# Patient Record
Sex: Male | Born: 1960
Health system: Southern US, Community
[De-identification: ages and names within clinical notes are randomized; demographics above are authoritative.]

## PROBLEM LIST (undated history)

## (undated) DIAGNOSIS — N200 Calculus of kidney: Secondary | ICD-10-CM

## (undated) DIAGNOSIS — R809 Proteinuria, unspecified: Secondary | ICD-10-CM

## (undated) DIAGNOSIS — E7800 Pure hypercholesterolemia, unspecified: Secondary | ICD-10-CM

## (undated) HISTORY — PX: REPAIR / RECONSTRUCTION COLLATERAL LIGAMENT HAND: SUR1146

---

## 2013-08-21 HISTORY — PX: OTHER SURGICAL HISTORY: SHX169

## 2013-12-22 ENCOUNTER — Other Ambulatory Visit (HOSPITAL_COMMUNITY)
Admission: RE | Admit: 2013-12-22 | Discharge: 2013-12-22 | Disposition: A | Discharge status: Home / Self Care | Payer: No Typology Code available for payment source | Source: Ambulatory Visit | Attending: Family Medicine | Admitting: Family Medicine

## 2013-12-22 ENCOUNTER — Emergency Department (HOSPITAL_COMMUNITY)
Admission: EM | Admit: 2013-12-22 | Discharge: 2013-12-22 | Disposition: A | Discharge status: Home / Self Care | Payer: No Typology Code available for payment source | Source: Home / Self Care | Attending: Emergency Medicine | Admitting: Emergency Medicine

## 2013-12-22 ENCOUNTER — Encounter (HOSPITAL_COMMUNITY): Payer: Self-pay | Admitting: Emergency Medicine

## 2013-12-22 DIAGNOSIS — Z113 Encounter for screening for infections with a predominantly sexual mode of transmission: Secondary | ICD-10-CM | POA: Insufficient documentation

## 2013-12-22 DIAGNOSIS — R3989 Other symptoms and signs involving the genitourinary system: Secondary | ICD-10-CM

## 2013-12-22 DIAGNOSIS — N399 Disorder of urinary system, unspecified: Secondary | ICD-10-CM

## 2013-12-22 HISTORY — DX: Calculus of kidney: N20.0

## 2013-12-22 HISTORY — DX: Pure hypercholesterolemia, unspecified: E78.00

## 2013-12-22 HISTORY — DX: Proteinuria, unspecified: R80.9

## 2013-12-22 LAB — POCT URINALYSIS DIP (DEVICE)
Bilirubin Urine: NEGATIVE
Glucose, UA: NEGATIVE mg/dL
Hgb urine dipstick: NEGATIVE
Ketones, ur: NEGATIVE mg/dL
Leukocytes, UA: NEGATIVE
Nitrite: NEGATIVE
PROTEIN: NEGATIVE mg/dL
Specific Gravity, Urine: >=1.03 (ref 1.005–1.030)
UROBILINOGEN UA: 0.2 mg/dL (ref 0.0–1.0)
pH: 6 (ref 5.0–8.0)

## 2013-12-22 NOTE — Discharge Instructions (Signed)
Please contact the urologist listed on your discharge paperwork should you wish additional evaluation

## 2013-12-22 NOTE — ED Provider Notes (Signed)
CSN: [id number]     Arrival date & time 12/22/13  1700 History   First MD Initiated Contact with Patient 12/22/13 1816     Chief Complaint  Patient presents with  . Proteinuria   (Consider location/radiation/quality/duration/timing/severity/associated sxs/prior Treatment) HPI Comments: Patient states [place] he went to see his MD about his concern that his urine was "foamy" and "dark." he was told by his MD that this may be an indication of excessive protein in his urine. He was prescribed an unknown medication to take for 3mos and was advised to then follow up. He has since moved here and is still having the issue despite having completed the medication as prescribed. Patient was also instructed to increase his daily water intake and he endorses that he has not done this.  PCP: none   The history is provided by the patient.    Past Medical History  Diagnosis Date  . Kidney stones   . High cholesterol   . Proteinuria    Past Surgical History  Procedure Laterality Date  . Nerve release hand Left 2015   Family History  Problem Relation Age of Onset  . Hypertension Father   . Stroke Brother    History  Substance Use Topics  . Smoking status: Never Smoker   . Smokeless tobacco: Not on file  . Alcohol Use: No    Review of Systems  Constitutional: Negative for fever, activity change and unexpected weight change.  HENT: Negative.   Eyes: Negative.   Respiratory: Negative.   Cardiovascular: Negative.   Gastrointestinal: Negative.   Endocrine: Negative for polydipsia, polyphagia and polyuria.  Genitourinary: Negative for dysuria, urgency, frequency, hematuria, flank pain, decreased urine volume, discharge, penile swelling, scrotal swelling, enuresis, difficulty urinating, genital sores, penile pain and testicular pain.       See HPI  Musculoskeletal: Negative.   Skin: Negative.   Allergic/Immunologic: Negative for  immunocompromised state.  Neurological: Negative.     Allergies  Review of patient's allergies indicates no known allergies.  Home Medications   Prior to Admission medications   Not on File   BP 142/79  Pulse 75  Temp(Src) 98 F (36.7 C) (Oral)  Resp 18  SpO2 96% Physical Exam  Nursing note and vitals reviewed. Constitutional: He is oriented to person, place, and time. He appears well-developed and well-nourished. No distress.  HENT:  Head: Normocephalic and atraumatic.  Eyes: Conjunctivae are normal. No scleral icterus.  Cardiovascular: Normal rate, regular rhythm and normal heart sounds.   Pulmonary/Chest: Effort normal and breath sounds normal.  Abdominal: Soft. Bowel sounds are normal. He exhibits no distension and no mass. There is no tenderness. There is no CVA tenderness.  Genitourinary:  Declined examination of his genitalia.   Musculoskeletal: Normal range of motion.  Neurological: He is alert and oriented to person, place, and time.  Skin: Skin is warm and dry. No rash noted.  Psychiatric: He has a normal mood and affect. His behavior is normal.    ED Course  Procedures (including critical care time) Labs Review Labs Reviewed  URINE CULTURE  POCT URINALYSIS DIP (DEVICE)  URINE CYTOLOGY ANCILLARY ONLY    Imaging Review No results found.   MDM   1. Urinary problem    UA: grossly normal with exception of increased spec gravity, indicating probable dehydration. Was again advised that he should increase his daily water intake.  Urine sent for C&S.  Suggested that if patient  has continued concern, I would be glad to refer him to urology and he stated he would welcome this information.  States he was pleased that his urine today was without evidence of proteinuria or infection.  Suggested to patient that he also work toward finding himself a PCP.   Jess BartersJennifer Lee MelvinPresson, GeorgiaPA 12/22/13 1911

## 2013-12-22 NOTE — ED Notes (Signed)
States his urine has been foamy for 3 mos. Sometimes its dark.  Hx. of same in his country and was told he had protein in his urine.  Was given 1 pill for 3 weeks and was supposed to f/u with his doctor but could not.  Medicine did not help. Can't remember the name of it.  Hx. Kidney stone.

## 2013-12-23 LAB — URINE CYTOLOGY ANCILLARY ONLY
Chlamydia: NEGATIVE
NEISSERIA GONORRHEA: NEGATIVE
TRICH (WINDOWPATH): NEGATIVE

## 2013-12-23 LAB — URINE CULTURE
Colony Count: NO GROWTH
Culture: NO GROWTH
Special Requests: NORMAL

## 2013-12-23 NOTE — ED Provider Notes (Signed)
Medical screening examination/treatment/procedure(s) were performed by resident physician or non-physician practitioner and as supervising physician I was immediately available for consultation/collaboration.   [name],[name].   [name], MD 12/23/13 0816 

## 2014-01-09 ENCOUNTER — Ambulatory Visit: Payer: No Typology Code available for payment source | Attending: Internal Medicine

## 2014-08-04 ENCOUNTER — Ambulatory Visit: Payer: No Typology Code available for payment source | Admitting: Family Medicine

## 2014-08-04 ENCOUNTER — Encounter (HOSPITAL_COMMUNITY): Payer: Self-pay | Admitting: Emergency Medicine

## 2014-08-04 ENCOUNTER — Emergency Department (HOSPITAL_COMMUNITY)
Admission: EM | Admit: 2014-08-04 | Discharge: 2014-08-04 | Disposition: A | Discharge status: Home / Self Care | Payer: No Typology Code available for payment source | Attending: Emergency Medicine | Admitting: Emergency Medicine

## 2014-08-04 DIAGNOSIS — Y92410 Unspecified street and highway as the place of occurrence of the external cause: Secondary | ICD-10-CM | POA: Insufficient documentation

## 2014-08-04 DIAGNOSIS — Y998 Other external cause status: Secondary | ICD-10-CM | POA: Insufficient documentation

## 2014-08-04 DIAGNOSIS — Z87442 Personal history of urinary calculi: Secondary | ICD-10-CM | POA: Insufficient documentation

## 2014-08-04 DIAGNOSIS — S199XXA Unspecified injury of neck, initial encounter: Secondary | ICD-10-CM | POA: Insufficient documentation

## 2014-08-04 DIAGNOSIS — Y9389 Activity, other specified: Secondary | ICD-10-CM | POA: Insufficient documentation

## 2014-08-04 DIAGNOSIS — Z8639 Personal history of other endocrine, nutritional and metabolic disease: Secondary | ICD-10-CM | POA: Insufficient documentation

## 2014-08-04 MED ORDER — CYCLOBENZAPRINE HCL 10 MG PO TABS: 10.0000 mg | ORAL_TABLET | Freq: Two times a day (BID) | ORAL | Status: DC | PRN

## 2014-08-04 MED ORDER — HYDROCODONE-ACETAMINOPHEN 5-325 MG PO TABS: 1.0000 | ORAL_TABLET | ORAL | Status: DC | PRN

## 2014-08-04 MED ORDER — MELOXICAM 15 MG PO TABS: 15.0000 mg | ORAL_TABLET | Freq: Every day | ORAL | Status: DC
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
## 2014-08-05 ENCOUNTER — Ambulatory Visit: Payer: No Typology Code available for payment source | Attending: Internal Medicine

## 2014-08-25 ENCOUNTER — Encounter: Payer: Self-pay | Admitting: Family Medicine

## 2014-08-25 ENCOUNTER — Ambulatory Visit (INDEPENDENT_AMBULATORY_CARE_PROVIDER_SITE_OTHER): Payer: No Typology Code available for payment source | Admitting: Family Medicine

## 2014-08-25 VITALS — BP 131/69 | HR 70 | Temp 98.2°F | Resp 16 | Ht 72.0 in | Wt 201.0 lb

## 2014-08-25 DIAGNOSIS — Z0000 Encounter for general adult medical examination without abnormal findings: Secondary | ICD-10-CM

## 2014-08-25 DIAGNOSIS — Z87898 Personal history of other specified conditions: Secondary | ICD-10-CM

## 2014-08-25 DIAGNOSIS — Z23 Encounter for immunization: Secondary | ICD-10-CM

## 2014-08-25 DIAGNOSIS — Z8639 Personal history of other endocrine, nutritional and metabolic disease: Secondary | ICD-10-CM

## 2014-08-25 NOTE — Patient Instructions (Addendum)

## 2014-08-25 NOTE — Progress Notes (Signed)
Subjective:    Patient ID: Jeffery Reyes, male    DOB: 11/06/1960, 54 y.o.   MRN: [id number]  HPI  Patient presents to establish care accompanied by wife. He reports that he has not had a primary physician since moving to the [place] from [place] 8 years ago. He states that he has had a previous nerve release surgery to left hand and spinal stenosis that was diagnosed in [place]. He states that he has occasional numbness and tingling in left upper extremity post surgery. He states that  Patient currently denies crepitation, decreased range of motion, deformity, edema, erythema, instability, tenderness and warmth.    Jeffery Reyes also states that he has a history of high cholesterol; he has not been following a low fat diet or medication regimen. He denies headache, shortness of breath, numbness, tingling, nausea, vomiting, or diarrhea.   Past Medical History  Diagnosis Date  . Kidney stones   . High cholesterol   . Proteinuria      Review of Systems  Constitutional: Negative.   Eyes: Negative.   Respiratory: Negative.  Negative for shortness of breath, wheezing and stridor.   Cardiovascular: Negative for palpitations and leg swelling.  Gastrointestinal: Negative.  Negative for nausea, diarrhea and constipation.  Endocrine: Negative.  Negative for polydipsia, polyphagia and polyuria.  Genitourinary: Negative.   Musculoskeletal: Negative.   Skin: Negative.   Allergic/Immunologic: Negative.   Neurological: Negative for syncope and speech difficulty.  Hematological: Negative.   Psychiatric/Behavioral: Negative.  Negative for suicidal ideas and sleep disturbance.       Objective:   Physical Exam  Constitutional: He is oriented to person, place, and time. He appears well-developed and well-nourished.  HENT:  Head: Normocephalic and atraumatic.  Right Ear: External ear normal.  Left Ear: External ear normal.  Mouth/Throat: Oropharynx is clear and moist.  Eyes: Conjunctivae and EOM are normal.  Pupils are equal, round, and reactive to light.  Neck: Normal range of motion. Neck supple.  Cardiovascular: Normal rate, regular rhythm, normal heart sounds and intact distal pulses.   Pulmonary/Chest: Effort normal and breath sounds normal.  Abdominal: Soft. Bowel sounds are normal.  Musculoskeletal: Normal range of motion.  Neurological: He is alert and oriented to person, place, and time. He has normal reflexes.  Skin: Skin is warm and dry.  Psychiatric: He has a normal mood and affect. His behavior is normal. Judgment and thought content normal.         BP 131/69 mmHg  Pulse 70  Temp(Src) 98.2 F (36.8 C) (Oral)  Resp 16  Ht 6' (1.829 m)  Wt 201 lb (91.173 kg)  BMI 27.25 kg/m2 Assessment & Plan:  1.  History of high cholesterol Recommend that he start a low fat, low carbohydrate diett divided over 6 small meals. Increase daily water intake, and exercise 3-4 times per week.   I will check a fasting llipid panel in 1 week  2. History of paresthesia He states that he has periodic numbness and tingling to upper extremities. He had a previous surgery for carpal tunnel and a history of spinal stenosis when in [place] 8 years ago.    3. Need for immunization against influenza - Flu Vaccine QUAD 36+ mos IM (Fluarix)  4. Need for Tdap vaccination - Tdap vaccine greater than or equal to 7yo IM  5. Annual physical exam Jeffery Reyes will return in 1 month for a complete physical examination with prostate check. He states that he has never had a  colonoscopy.  - Lipid Panel; Future - COMPLETE METABOLIC PANEL WITH GFR; Future - CBC with Differential; Future - Vitamin D, 25-hydroxy; Future - Urinalysis, Complete; Future    Chart reviewed: immunizations are up to date and documented.  Massie MaroonHollis,Lachina M, FNP

## 2014-09-01 ENCOUNTER — Other Ambulatory Visit: Payer: Self-pay | Admitting: Family Medicine

## 2014-09-01 ENCOUNTER — Other Ambulatory Visit (INDEPENDENT_AMBULATORY_CARE_PROVIDER_SITE_OTHER): Payer: No Typology Code available for payment source

## 2014-09-01 DIAGNOSIS — Z0000 Encounter for general adult medical examination without abnormal findings: Secondary | ICD-10-CM

## 2014-09-01 LAB — COMPLETE METABOLIC PANEL WITH GFR
ALBUMIN: 4.1 g/dL (ref 3.5–5.2)
ALK PHOS: 46 U/L (ref 39–117)
ALT: 12 U/L (ref 0–53)
AST: 15 U/L (ref 0–37)
BUN: 10 mg/dL (ref 6–23)
CO2: 27 mEq/L (ref 19–32)
Calcium: 9.3 mg/dL (ref 8.4–10.5)
Chloride: 104 mEq/L (ref 96–112)
Creat: 0.84 mg/dL (ref 0.50–1.35)
GFR, Est African American: >89 mL/min
GFR, Est Non African American: >89 mL/min
Glucose, Bld: 95 mg/dL (ref 70–99)
Potassium: 3.8 mEq/L (ref 3.5–5.3)
Sodium: 139 mEq/L (ref 135–145)
Total Bilirubin: 0.6 mg/dL (ref 0.2–1.2)
Total Protein: 6.6 g/dL (ref 6.0–8.3)

## 2014-09-01 LAB — LIPID PANEL
CHOL/HDL RATIO: 4.4 ratio
CHOLESTEROL: 199 mg/dL (ref 0–200)
HDL: 45 mg/dL (ref >39)
LDL Cholesterol: 125 mg/dL — ABNORMAL HIGH (ref 0–99)
Triglycerides: 143 mg/dL (ref <150)
VLDL: 29 mg/dL (ref 0–40)

## 2014-09-02 LAB — CBC WITH DIFFERENTIAL/PLATELET
BASOS ABS: 0.1 10*3/uL (ref 0.0–0.1)
BASOS PCT: 2 % — AB (ref 0–1)
EOS ABS: 0.1 10*3/uL (ref 0.0–0.7)
EOS PCT: 4 % (ref 0–5)
HCT: 43.5 % (ref 39.0–52.0)
HEMOGLOBIN: 15.2 g/dL (ref 13.0–17.0)
LYMPHS ABS: 1.8 10*3/uL (ref 0.7–4.0)
Lymphocytes Relative: 55 % — ABNORMAL HIGH (ref 12–46)
MCH: 30.2 pg (ref 26.0–34.0)
MCHC: 34.9 g/dL (ref 30.0–36.0)
MCV: 86.3 fL (ref 78.0–100.0)
MPV: 10.4 fL (ref 8.6–12.4)
Monocytes Absolute: 0.3 10*3/uL (ref 0.1–1.0)
Monocytes Relative: 10 % (ref 3–12)
Neutro Abs: 0.9 10*3/uL — ABNORMAL LOW (ref 1.7–7.7)
Neutrophils Relative %: 29 % — ABNORMAL LOW (ref 43–77)
PLATELETS: 206 10*3/uL (ref 150–400)
RBC: 5.04 MIL/uL (ref 4.22–5.81)
RDW: 13.5 % (ref 11.5–15.5)
WBC: 3.2 10*3/uL — ABNORMAL LOW (ref 4.0–10.5)

## 2014-09-02 LAB — URINALYSIS, COMPLETE
Bacteria, UA: NONE SEEN
Casts: NONE SEEN
Crystals: NONE SEEN
GLUCOSE, UA: NEGATIVE mg/dL
HGB URINE DIPSTICK: NEGATIVE
KETONES UR: NEGATIVE mg/dL
Leukocytes, UA: NEGATIVE
Nitrite: NEGATIVE
Protein, ur: NEGATIVE mg/dL
SPECIFIC GRAVITY, URINE: 1.026 (ref 1.005–1.030)
Squamous Epithelial / LPF: NONE SEEN
Urobilinogen, UA: 0.2 mg/dL (ref 0.0–1.0)
pH: 5.5 (ref 5.0–8.0)

## 2014-09-02 LAB — PATHOLOGIST SMEAR REVIEW

## 2014-09-02 LAB — VITAMIN D 25 HYDROXY (VIT D DEFICIENCY, FRACTURES): VIT D 25 HYDROXY: 10 ng/mL — AB (ref 30–100)

## 2014-09-04 ENCOUNTER — Telehealth: Payer: Self-pay | Admitting: Family Medicine

## 2014-09-04 DIAGNOSIS — D709 Neutropenia, unspecified: Secondary | ICD-10-CM | POA: Insufficient documentation

## 2014-09-04 DIAGNOSIS — E559 Vitamin D deficiency, unspecified: Secondary | ICD-10-CM | POA: Insufficient documentation

## 2014-09-04 LAB — HIV ANTIBODY (ROUTINE TESTING W REFLEX): HIV 1&2 Ab, 4th Generation: NONREACTIVE

## 2014-09-04 MED ORDER — ERGOCALCIFEROL 1.25 MG (50000 UT) PO CAPS: 50000.0000 [IU] | ORAL_CAPSULE | ORAL | Status: DC

## 2014-09-04 NOTE — Telephone Encounter (Signed)
Meds ordered this encounter  Medications  . ergocalciferol (DRISDOL) 50000 UNITS capsule    Sig: Take 1 capsule (50,000 Units total) by mouth once a week.    Dispense:  4 capsule    Refill:  3  Reviewed labs; Mr. Jeffery Reyes has a vitamin D deficiency, I will start weekly vitamin D replacement. Patient also has neutropenia, I will repeat CBC in 1 month.    Massie MaroonHollis,Jeffery [place] M, FNP

## 2014-09-07 ENCOUNTER — Ambulatory Visit: Payer: No Typology Code available for payment source | Attending: Internal Medicine

## 2014-09-07 NOTE — Telephone Encounter (Signed)
Called, no answer, left message.  need to advise patient of vitamin D deficiency.

## 2014-09-23 ENCOUNTER — Other Ambulatory Visit: Payer: Self-pay | Admitting: Family Medicine

## 2014-09-23 ENCOUNTER — Ambulatory Visit (INDEPENDENT_AMBULATORY_CARE_PROVIDER_SITE_OTHER): Payer: No Typology Code available for payment source | Admitting: Family Medicine

## 2014-09-23 VITALS — BP 137/75 | HR 71 | Temp 98.1°F | Resp 16 | Ht 72.0 in | Wt 202.0 lb

## 2014-09-23 DIAGNOSIS — Z0000 Encounter for general adult medical examination without abnormal findings: Secondary | ICD-10-CM

## 2014-09-23 DIAGNOSIS — E785 Hyperlipidemia, unspecified: Secondary | ICD-10-CM | POA: Insufficient documentation

## 2014-09-23 LAB — IFOBT (OCCULT BLOOD): IFOBT: NEGATIVE

## 2014-09-23 MED ORDER — ASPIRIN EC 81 MG PO TBEC: 81.0000 mg | DELAYED_RELEASE_TABLET | Freq: Every day | ORAL | Status: DC

## 2014-09-23 NOTE — Patient Instructions (Addendum)
Dyslipidemia Dyslipidemia is an imbalance of the lipids in your blood. Lipids are waxy, fat-like proteins that your body needs in small amounts. Dyslipidemia often involves the lipids cholesterol or triglycerides. Common forms of dyslipidemia are:  High levels of bad cholesterol (LDL cholesterol). LDL cholesterol is the type of cholesterol that causes heart disease.  Low levels of good cholesterol (HDL cholesterol). HDL cholesterol is the type of cholesterol that helps protect against heart disease.  High levels of triglycerides. Triglycerides are a fatty substance in the blood linked to a buildup of plaque on your arteries. RISK FACTORS  Increased age.  Having a family history of high cholesterol.  Certain medicines, including birth control pills, diuretics, beta-blockers, and some medicines for depression.  Smoking.  Eating a high-fat diet.  Being overweight.  Medical conditions such as diabetes, polycystic ovary syndrome, pregnancy, kidney disease, and hypothyroidism.  Lack of regular exercise. SIGNS AND SYMPTOMS There are no signs or symptoms with dyslipidemia.  DIAGNOSIS  A simple blood test called a fasting blood test can be done to determine your level of:  Total cholesterol. This is the combined number of LDL cholesterol and HDL cholesterol. A healthy number is lower than 200.  LDL cholesterol. The goal number for LDL cholesterol is different for each person depending on risk factors. Ask your health care provider what your LDL cholesterol number should be.  HDL cholesterol. A healthy level of HDL cholesterol is 60 or higher. A number lower than 40 for men or 50 for women is a danger sign.  Triglycerides. A healthy triglyceride number is less than 150. TREATMENT  Dyslipidemia is a treatable condition. Your health care provider will advise you on what type of treatment is best based on your age, your test results, and current guidelines. Treatment may include:   Dietary  changes. A dietitian can help you create a meal plan. You may need to:  Eat more foods that contain omega-3s, such as salmon and other fish.  Replace saturated fats and trans fats in your diet with healthy fats such as nuts, seeds, avocados, olive oil, and canola oil.  Regular exercise. This can help lower your LDL cholesterol, raise your HDL cholesterol, and help with weight management. Check with your health care provider before beginning an exercise program. Most people should participate in 30 minutes of brisk exercise 5 days a week.  Quitting smoking.  Medicines to lower LDL cholesterol and triglycerides. Your health care provider will monitor your lipid levels with regular blood tests. HOME CARE INSTRUCTIONS  Eat a healthy diet. Follow any diet instructions if they were given to you by your health care provider.  Maintain a healthy weight.  Exercise regularly based on the recommendations of your health care provider.  Do not use any tobacco products, including cigarettes, chewing tobacco, or electronic cigarettes.  Take medicines only as directed by your health care provider.  Keep all follow-up visits as directed by your health care provider. SEEK MEDICAL CARE IF: You are having possible side effects from your medicines. Document Released: 08/12/2013 Document Revised: 12/22/2013 Document Reviewed: 08/12/2013 [facility] Patient Information 2015 [place], [place]. This information is not intended to replace advice given to you by your health care provider. Make sure you discuss any questions you have with your health care provider. Health Maintenance A healthy lifestyle and preventative care can promote health and wellness.  Maintain regular health, dental, and eye exams.  Eat a healthy diet. Foods like vegetables, fruits, whole grains, low-fat dairy products, and  lean protein foods contain the nutrients you need and are low in calories. Decrease your intake of foods high in solid  fats, added sugars, and salt. Get information about a proper diet from your health care provider, if necessary.  Regular physical exercise is one of the most important things you can do for your health. Most adults should get at least 150 minutes of moderate-intensity exercise (any activity that increases your heart rate and causes you to sweat) each week. In addition, most adults need muscle-strengthening exercises on 2 or more days a week.   Maintain a healthy weight. The body mass index (BMI) is a screening tool to identify possible weight problems. It provides an estimate of body fat based on height and weight. Your health care provider can find your BMI and can help you achieve or maintain a healthy weight. For males 20 years and older:  A BMI below 18.5 is considered underweight.  A BMI of 18.5 to 24.9 is normal.  A BMI of 25 to 29.9 is considered overweight.  A BMI of 30 and above is considered obese.  Maintain normal blood lipids and cholesterol by exercising and minimizing your intake of saturated fat. Eat a balanced diet with plenty of fruits and vegetables. Blood tests for lipids and cholesterol should begin at age 17 and be repeated every 5 years. If your lipid or cholesterol levels are high, you are over age 38, or you are at high risk for heart disease, you may need your cholesterol levels checked more frequently.Ongoing high lipid and cholesterol levels should be treated with medicines if diet and exercise are not working.  If you smoke, find out from your health care provider how to quit. If you do not use tobacco, do not start.  Lung cancer screening is recommended for adults aged 55-80 years who are at high risk for developing lung cancer because of a history of smoking. A yearly low-dose CT scan of the lungs is recommended for people who have at least a 30-pack-year history of smoking and are current smokers or have quit within the past 15 years. A pack year of smoking is  smoking an average of 1 pack of cigarettes a day for 1 year (for example, a 30-pack-year history of smoking could mean smoking 1 pack a day for 30 years or 2 packs a day for 15 years). Yearly screening should continue until the smoker has stopped smoking for at least 15 years. Yearly screening should be stopped for people who develop a health problem that would prevent them from having lung cancer treatment.  If you choose to drink alcohol, do not have more than 2 drinks per day. One drink is considered to be 12 oz (360 mL) of beer, 5 oz (150 mL) of wine, or 1.5 oz (45 mL) of liquor.  Avoid the use of street drugs. Do not share needles with anyone. Ask for help if you need support or instructions about stopping the use of drugs.  High blood pressure causes heart disease and increases the risk of stroke. Blood pressure should be checked at least every 1-2 years. Ongoing high blood pressure should be treated with medicines if weight loss and exercise are not effective.  If you are 98-40 years old, ask your health care provider if you should take aspirin to prevent heart disease.  Diabetes screening involves taking a blood sample to check your fasting blood sugar level. This should be done once every 3 years after age 44 if  you are at a normal weight and without risk factors for diabetes. Testing should be considered at a younger age or be carried out more frequently if you are overweight and have at least 1 risk factor for diabetes.  Colorectal cancer can be detected and often prevented. Most routine colorectal cancer screening begins at the age of 26 and continues through age 62. However, your health care provider may recommend screening at an earlier age if you have risk factors for colon cancer. On a yearly basis, your health care provider may provide home test kits to check for hidden blood in the stool. A small camera at the end of a tube may be used to directly examine the colon (sigmoidoscopy or  colonoscopy) to detect the earliest forms of colorectal cancer. Talk to your health care provider about this at age 26 when routine screening begins. A direct exam of the colon should be repeated every 5-10 years through age 59, unless early forms of precancerous polyps or small growths are found.  People who are at an increased risk for hepatitis B should be screened for this virus. You are considered at high risk for hepatitis B if:  You were born in a country where hepatitis B occurs often. Talk with your health care provider about which countries are considered high risk.  Your parents were born in a high-risk country and you have not received a shot to protect against hepatitis B (hepatitis B vaccine).  You have HIV or AIDS.  You use needles to inject street drugs.  You live with, or have sex with, someone who has hepatitis B.  You are a man who has sex with other men (MSM).  You get hemodialysis treatment.  You take certain medicines for conditions like cancer, organ transplantation, and autoimmune conditions.  Hepatitis C blood testing is recommended for all people born from [birth year] through 1964 and any individual with known risk factors for hepatitis C.  Healthy men should no longer receive prostate-specific antigen (PSA) blood tests as part of routine cancer screening. Talk to your health care provider about prostate cancer screening.  Testicular cancer screening is not recommended for adolescents or adult males who have no symptoms. Screening includes self-exam, a health care provider exam, and other screening tests. Consult with your health care provider about any symptoms you have or any concerns you have about testicular cancer.  Practice safe sex. Use condoms and avoid high-risk sexual practices to reduce the spread of sexually transmitted infections (STIs).  You should be screened for STIs, including gonorrhea and chlamydia if:  You are sexually active and are younger than  24 years.  You are older than 24 years, and your health care provider tells you that you are at risk for this type of infection.  Your sexual activity has changed since you were last screened, and you are at an increased risk for chlamydia or gonorrhea. Ask your health care provider if you are at risk.  If you are at risk of being infected with HIV, it is recommended that you take a prescription medicine daily to prevent HIV infection. This is called pre-exposure prophylaxis (PrEP). You are considered at risk if:  You are a man who has sex with other men (MSM).  You are a heterosexual man who is sexually active with multiple partners.  You take drugs by injection.  You are sexually active with a partner who has HIV.  Talk with your health care provider about whether you are at  high risk of being infected with HIV. If you choose to begin PrEP, you should first be tested for HIV. You should then be tested every 3 months for as long as you are taking PrEP.  Use sunscreen. Apply sunscreen liberally and repeatedly throughout the day. You should seek shade when your shadow is shorter than you. Protect yourself by wearing long sleeves, pants, a wide-brimmed hat, and sunglasses year round whenever you are outdoors.  Tell your health care provider of new moles or changes in moles, especially if there is a change in shape or color. Also, tell your health care provider if a mole is larger than the size of a pencil eraser.  A one-time screening for abdominal aortic aneurysm (AAA) and surgical repair of large AAAs by ultrasound is recommended for men aged 65-75 years who are current or former smokers.  Stay current with your vaccines (immunizations). Document Released: 02/03/2008 Document Revised: 08/12/2013 Document Reviewed: 01/02/2011 [facility] Patient Information 2015 [place], [place]. This information is not intended to replace advice given to you by your health care provider. Make sure you discuss  any questions you have with your health care provider. Vitamin D Deficiency Vitamin D is an important vitamin that your body needs. Having too little of it in your body is called a deficiency. A very bad deficiency can make your bones soft and can cause a condition called rickets.  Vitamin D is important to your body for different reasons, such as:   It helps your body absorb 2 minerals called calcium and phosphorus.  It helps make your bones healthy.  It may prevent some diseases, such as diabetes and multiple sclerosis.  It helps your muscles and heart. You can get vitamin D in several ways. It is a natural part of some foods. The vitamin is also added to some dairy products and cereals. Some people take vitamin D supplements. Also, your body makes vitamin D when you are in the sun. It changes the sun's rays into a form of the vitamin that your body can use. CAUSES   Not eating enough foods that contain vitamin D.  Not getting enough sunlight.  Having certain digestive system diseases that make it hard to absorb vitamin D. These diseases include Crohn's disease, chronic pancreatitis, and cystic fibrosis.  Having a surgery in which part of the stomach or small intestine is removed.  Being obese. Fat cells pull vitamin D out of your blood. That means that obese people may not have enough vitamin D left in their blood and in other body tissues.  Having chronic kidney or liver disease. RISK FACTORS Risk factors are things that make you more likely to develop a vitamin D deficiency. They include:  Being older.  Not being able to get outside very much.  Living in a nursing home.  Having had broken bones.  Having weak or thin bones (osteoporosis).  Having a disease or condition that changes how your body absorbs vitamin D.  Having dark skin.  Some medicines such as seizure medicines or steroids.  Being overweight or obese. SYMPTOMS Mild cases of vitamin D deficiency may not  have any symptoms. If you have a very bad case, symptoms may include:  Bone pain.  Muscle pain.  Falling often.  Broken bones caused by a minor injury, due to osteoporosis. DIAGNOSIS A blood test is the best way to tell if you have a vitamin D deficiency. TREATMENT Vitamin D deficiency can be treated in different ways. Treatment for  vitamin D deficiency depends on what is causing it. Options include:  Taking vitamin D supplements.  Taking a calcium supplement. Your caregiver will suggest what dose is best for you. HOME CARE INSTRUCTIONS  Take any supplements that your caregiver prescribes. Follow the directions carefully. Take only the suggested amount.  Have your blood tested 2 months after you start taking supplements.  Eat foods that contain vitamin D. Healthy choices include:  Fortified dairy products, cereals, or juices. Fortified means vitamin D has been added to the food. Check the label on the package to be sure.  Fatty fish like salmon or trout.  Eggs.  Oysters.  Do not use a tanning bed.  Keep your weight at a healthy level. Lose weight if you need to.  Keep all follow-up appointments. Your caregiver will need to perform blood tests to make sure your vitamin D deficiency is going away. SEEK MEDICAL CARE IF:  You have any questions about your treatment.  You continue to have symptoms of vitamin D deficiency.  You have nausea or vomiting.  You are constipated.  You feel confused.  You have severe abdominal or back pain. MAKE SURE YOU:  Understand these instructions.  Will watch your condition.  Will get help right away if you are not doing well or get worse. Document Released: 10/30/2011 Document Revised: 12/02/2012 Document Reviewed: 10/30/2011 [facility] Patient Information 2015 [place]. This information is not intended to replace advice given to you by your health care provider. Make sure you discuss any questions you have with your health  care provider.

## 2014-09-23 NOTE — Progress Notes (Signed)
[email or web address] [email or web address] Patient ID: Jeffery Reyes, male   DOB: Jul 16, 1961, 54 y.o.   MRN: [id number]  Chief Complaint  Patient presents with  . Annual Exam    HPI Jeffery Reyes is a 54 y.o. male that presents for a complete physical examination. He reports that he is married with 5 children ranging in age from 8712 to 318. He is currently employed.  Jeffery Reyes states that he feels well and is without current complaint. He states that exercises several times per week. He just started an exercise regimen at the beginning of the year. He states that he does not eat a balanced diet. He denies wearing sunscreen. He also does not go to the dentist twice yearly, but has yearly eye examinations.  HPI  Past Medical History  Diagnosis Date  . Kidney stones   . High cholesterol   . Proteinuria     Past Surgical History  Procedure Laterality Date  . Nerve release hand Left 2015    Family History  Problem Relation Age of Onset  . Hypertension Father   . Stroke Brother     Social History History  Substance Use Topics  . Smoking status: Former Games developermoker  . Smokeless tobacco: Not on file  . Alcohol Use: No    No Known Allergies  Current Outpatient Prescriptions  Medication Sig Dispense Refill  . ergocalciferol (DRISDOL) 50000 UNITS capsule Take 1 capsule (50,000 Units total) by mouth once a week. (Patient not taking: Reported on 09/23/2014) 4 capsule 3   No current facility-administered medications for this visit.    Review of Systems Review of Systems  Constitutional: Negative.   HENT: Negative.   Eyes: Negative.   Respiratory: Negative.   Cardiovascular: Negative.   Gastrointestinal: Negative.   Endocrine: Negative.   Genitourinary: Negative.   Musculoskeletal: Negative.   Neurological: Negative.   Hematological: Negative.   Psychiatric/Behavioral: Negative.     Blood pressure 137/75, pulse 71, temperature 98.1 F (36.7 C), temperature source Oral, resp. rate 16, height 6' (1.829 m), weight 202 lb  (91.627 kg).  Physical Exam Physical Exam  Constitutional: He is oriented to person, place, and time.  HENT:  Head: Normocephalic and atraumatic.  Right Ear: External ear normal.  Left Ear: External ear normal.  Mouth/Throat: Oropharynx is clear and moist.  Eyes: Conjunctivae are normal. Pupils are equal, round, and reactive to light.  Neck: Normal range of motion. Neck supple.  Pulmonary/Chest: Effort normal and breath sounds normal.  Abdominal: Soft. Bowel sounds are normal.  Genitourinary: Rectum normal, prostate normal, testes normal and penis normal. Uncircumcised.  Musculoskeletal: Normal range of motion.  Neurological: He is alert and oriented to person, place, and time. He has normal reflexes.  Skin: Skin is warm and dry.  Psychiatric: He has a normal mood and affect. His behavior is normal. Judgment and thought content normal.    Data Reviewed Reviewed previous labs.   Assessment   Reviewed laboratory values. Cholesterol results reviewed. Current ASCVD 11.3 %, recommend moderate dose statin therapy.     During the course of the visit the patient was educated and counseled about appropriate screening and preventive services including:    Influenza vaccine  Td vaccine  Screening electrocardiogram  Colorectal cancer screening  Diabetes screening  Glaucoma screening  Nutrition counseling   Advanced directives: requested  Screening recommendations, referrals: Vaccinations: Please see documentation below and orders this visit.  Nutrition assessed and recommended  Colonoscopy in 2013 Recommend that Jeffery Reyes continues yearly eye exam Recommended  yearly dental visit for hygiene and checkup   Conditions/risks identified: BMI: Discussed weight loss, diet, and increase physical activity.  Increase physical activity: AHA recommends 150 minutes of physical activity a week. Patient is not interested in statin therapy at this point. He will start a low impact  cardiovascular exercise routine 3-4 days per week. Also, low fat ,low salt diet divided over 6 small meals.    Medications reviewed     Annual physical exam - IFOBT POC (occult bld, rslt in office) - EKG 12-Lead   Patient reports Last colonoscopy in 2013   Jeffery Reyes M 09/23/2014, 11:11 AM

## 2014-09-24 ENCOUNTER — Encounter: Payer: Self-pay | Admitting: Family Medicine

## 2015-02-25 ENCOUNTER — Ambulatory Visit: Payer: No Typology Code available for payment source | Admitting: Internal Medicine

## 2015-03-17 ENCOUNTER — Other Ambulatory Visit: Payer: No Typology Code available for payment source

## 2015-03-25 ENCOUNTER — Ambulatory Visit: Payer: No Typology Code available for payment source | Admitting: Family Medicine

## 2015-08-03 ENCOUNTER — Ambulatory Visit: Payer: No Typology Code available for payment source | Attending: Internal Medicine

## 2015-08-09 ENCOUNTER — Ambulatory Visit: Payer: No Typology Code available for payment source

## 2015-11-12 ENCOUNTER — Encounter: Payer: Self-pay | Admitting: Family Medicine

## 2015-11-12 ENCOUNTER — Encounter: Payer: No Typology Code available for payment source | Admitting: Family Medicine

## 2015-11-12 NOTE — Progress Notes (Signed)
This encounter was created in error - please disregard.

## 2015-11-19 ENCOUNTER — Encounter: Payer: Self-pay | Admitting: Family Medicine

## 2015-11-19 ENCOUNTER — Ambulatory Visit (INDEPENDENT_AMBULATORY_CARE_PROVIDER_SITE_OTHER): Payer: No Typology Code available for payment source | Admitting: Family Medicine

## 2015-11-19 VITALS — BP 120/71 | HR 70 | Temp 98.2°F | Resp 18 | Ht 74.0 in | Wt 205.0 lb

## 2015-11-19 DIAGNOSIS — K219 Gastro-esophageal reflux disease without esophagitis: Secondary | ICD-10-CM

## 2015-11-19 MED ORDER — OMEPRAZOLE 20 MG PO CPDR: 20.0000 mg | DELAYED_RELEASE_CAPSULE | Freq: Every day | ORAL | Status: DC

## 2015-11-19 NOTE — Patient Instructions (Signed)
Food Choices for Gastroesophageal Reflux Disease, Adult °When you have gastroesophageal reflux disease (GERD), the foods you eat and your eating habits are very important. Choosing the right foods can help ease the discomfort of GERD. °WHAT GENERAL GUIDELINES DO I NEED TO FOLLOW? °· Choose fruits, vegetables, whole grains, low-fat dairy products, and low-fat meat, fish, and poultry. °· Limit fats such as oils, salad dressings, butter, nuts, and avocado. °· Keep a food diary to identify foods that cause symptoms. °· Avoid foods that cause reflux. These may be different for different people. °· Eat frequent small meals instead of three large meals each day. °· Eat your meals slowly, in a relaxed setting. °· Limit fried foods. °· Cook foods using methods other than frying. °· Avoid drinking alcohol. °· Avoid drinking large amounts of liquids with your meals. °· Avoid bending over or lying down until 2-3 hours after eating. °WHAT FOODS ARE NOT RECOMMENDED? °The following are some foods and drinks that may worsen your symptoms: °Vegetables °Tomatoes. Tomato juice. Tomato and spaghetti sauce. Chili peppers. Onion and garlic. Horseradish. °Fruits °Oranges, grapefruit, and lemon (fruit and juice). °Meats °High-fat meats, fish, and poultry. This includes hot dogs, ribs, ham, sausage, salami, and bacon. °Dairy °Whole milk and chocolate milk. Sour cream. Cream. Butter. Ice cream. Cream cheese.  °Beverages °Coffee and tea, with or without caffeine. Carbonated beverages or energy drinks. °Condiments °Hot sauce. Barbecue sauce.  °Sweets/Desserts °Chocolate and cocoa. Donuts. Peppermint and spearmint. °Fats and Oils °High-fat foods, including French fries and potato chips. °Other °Vinegar. Strong spices, such as black pepper, white pepper, red pepper, cayenne, curry powder, cloves, ginger, and chili powder. °The items listed above may not be a complete list of foods and beverages to avoid. Contact your dietitian for more  information. °  °This information is not intended to replace advice given to you by your health care provider. Make sure you discuss any questions you have with your health care provider. °  °Document Released: 08/07/2005 Document Revised: 08/28/2014 Document Reviewed: 06/11/2013 °Elsevier Interactive Patient Education ©2016 Elsevier Inc. ° °Gastroesophageal Reflux Disease, Adult °Normally, food travels down the esophagus and stays in the stomach to be digested. However, when a person has gastroesophageal reflux disease (GERD), food and stomach acid move back up into the esophagus. When this happens, the esophagus becomes sore and inflamed. Over time, GERD can create small holes (ulcers) in the lining of the esophagus.  °CAUSES °This condition is caused by a problem with the muscle between the esophagus and the stomach (lower esophageal sphincter, or LES). Normally, the LES muscle closes after food passes through the esophagus to the stomach. When the LES is weakened or abnormal, it does not close properly, and that allows food and stomach acid to go back up into the esophagus. The LES can be weakened by certain dietary substances, medicines, and medical conditions, including: °· Tobacco use. °· Pregnancy. °· Having a hiatal hernia. °· Heavy alcohol use. °· Certain foods and beverages, such as coffee, chocolate, onions, and peppermint. °RISK FACTORS °This condition is more likely to develop in: °· People who have an increased body weight. °· People who have connective tissue disorders. °· People who use NSAID medicines. °SYMPTOMS °Symptoms of this condition include: °· Heartburn. °· Difficult or painful swallowing. °· The feeling of having a lump in the throat. °· A bitter taste in the mouth. °· Bad breath. °· Having a large amount of saliva. °· Having an upset or bloated stomach. °· Belching. °· Chest pain. °·   Shortness of breath or wheezing. °· Ongoing (chronic) cough or a night-time cough. °· Wearing away of  tooth enamel. °· Weight loss. °Different conditions can cause chest pain. Make sure to see your health care provider if you experience chest pain. °DIAGNOSIS °Your health care provider will take a medical history and perform a physical exam. To determine if you have mild or severe GERD, your health care provider may also monitor how you respond to treatment. You may also have other tests, including: °· An endoscopy to examine your stomach and esophagus with a small camera. °· A test that measures the acidity level in your esophagus. °· A test that measures how much pressure is on your esophagus. °· A barium swallow or modified barium swallow to show the shape, size, and functioning of your esophagus. °TREATMENT °The goal of treatment is to help relieve your symptoms and to prevent complications. Treatment for this condition may vary depending on how severe your symptoms are. Your health care provider may recommend: °· Changes to your diet. °· Medicine. °· Surgery. °HOME CARE INSTRUCTIONS °Diet °· Follow a diet as recommended by your health care provider. This may involve avoiding foods and drinks such as: °¨ Coffee and tea (with or without caffeine). °¨ Drinks that contain alcohol. °¨ Energy drinks and sports drinks. °¨ Carbonated drinks or sodas. °¨ Chocolate and cocoa. °¨ Peppermint and mint flavorings. °¨ Garlic and onions. °¨ Horseradish. °¨ Spicy and acidic foods, including peppers, chili powder, curry powder, vinegar, hot sauces, and barbecue sauce. °¨ Citrus fruit juices and citrus fruits, such as oranges, lemons, and limes. °¨ Tomato-based foods, such as red sauce, chili, salsa, and pizza with red sauce. °¨ Fried and fatty foods, such as donuts, french fries, potato chips, and high-fat dressings. °¨ High-fat meats, such as hot dogs and fatty cuts of red and white meats, such as rib eye steak, sausage, ham, and bacon. °¨ High-fat dairy items, such as whole milk, butter, and cream cheese. °· Eat small,  frequent meals instead of large meals. °· Avoid drinking large amounts of liquid with your meals. °· Avoid eating meals during the 2-3 hours before bedtime. °· Avoid lying down right after you eat. °· Do not exercise right after you eat. ° General Instructions  °· Pay attention to any changes in your symptoms. °· Take over-the-counter and prescription medicines only as told by your health care provider. Do not take aspirin, ibuprofen, or other NSAIDs unless your health care provider told you to do so. °· Do not use any tobacco products, including cigarettes, chewing tobacco, and e-cigarettes. If you need help quitting, ask your health care provider. °· Wear loose-fitting clothing. Do not wear anything tight around your waist that causes pressure on your abdomen. °· Raise (elevate) the head of your bed 6 inches (15cm). °· Try to reduce your stress, such as with yoga or meditation. If you need help reducing stress, ask your health care provider. °· If you are overweight, reduce your weight to an amount that is healthy for you. Ask your health care provider for guidance about a safe weight loss goal. °· Keep all follow-up visits as told by your health care provider. This is important. °SEEK MEDICAL CARE IF: °· You have new symptoms. °· You have unexplained weight loss. °· You have difficulty swallowing, or it hurts to swallow. °· You have wheezing or a persistent cough. °· Your symptoms do not improve with treatment. °· You have a hoarse voice. °SEEK IMMEDIATE MEDICAL CARE IF: °· You have pain   in your arms, neck, jaw, teeth, or back. °· You feel sweaty, dizzy, or light-headed. °· You have chest pain or shortness of breath. °· You vomit and your vomit looks like blood or coffee grounds. °· You faint. °· Your stool is bloody or black. °· You cannot swallow, drink, or eat. °  °This information is not intended to replace advice given to you by your health care provider. Make sure you discuss any questions you have with  your health care provider. °  °Document Released: 05/17/2005 Document Revised: 04/28/2015 Document Reviewed: 12/02/2014 °Elsevier Interactive Patient Education ©2016 Elsevier Inc. ° °

## 2015-11-19 NOTE — Progress Notes (Signed)
Subjective:    Patient ID: Jeffery Reyes, male    DOB: 29-Sep-1960, 55 y.o.   MRN: [id number]  Gastroesophageal Reflux He complains of abdominal pain and chest pain. He reports no choking, no coughing, no dysphagia, no early satiety, no globus sensation, no heartburn, no hoarse voice, no nausea, no sore throat, no stridor, no tooth decay, no water brash or no wheezing. This is a recurrent problem. The current episode started more than 1 year ago. The problem occurs occasionally. The problem has been unchanged. The symptoms are aggravated by certain foods, bending and lying down. Risk factors include caffeine use, lack of exercise and obesity. He has tried nothing for the symptoms. The treatment provided no relief. Past procedures do not include an abdominal ultrasound, an EGD, esophageal manometry, esophageal pH monitoring, H. pylori antibody titer or a UGI. Past invasive treatments do not include gastroplasty, gastroplication or reflux surgery.   Past Medical History  Diagnosis Date  . Kidney stones   . High cholesterol   . Proteinuria    Immunization History  Administered Date(s) Administered  . Influenza,inj,Quad PF,36+ Mos 08/25/2014  . Tdap 08/25/2014   Social History   Social History  . Marital Status: Married    Spouse Name: N/A  . Number of Children: N/A  . Years of Education: N/A   Occupational History  . Not on file.   Social History Main Topics  . Smoking status: Former Games developermoker  . Smokeless tobacco: Not on file  . Alcohol Use: No  . Drug Use: No  . Sexual Activity: Not on file   Other Topics Concern  . Not on file   Social History Narrative   Review of Systems  Constitutional: Negative.   HENT: Negative.  Negative for hoarse voice and sore throat.   Eyes: Negative.   Respiratory: Negative.  Negative for cough, choking and wheezing.   Cardiovascular: Positive for chest pain.  Gastrointestinal: Positive for abdominal pain. Negative for heartburn, dysphagia and  nausea.  Endocrine: Negative.   Genitourinary: Positive for flank pain.  Musculoskeletal: Negative.   Skin: Negative.   Allergic/Immunologic: Negative.   Neurological: Negative.   Hematological: Negative.   Psychiatric/Behavioral: Negative.        Objective:   Physical Exam  Constitutional: He is oriented to person, place, and time. He appears well-developed and well-nourished.  HENT:  Head: Normocephalic and atraumatic.  Right Ear: External ear normal.  Left Ear: External ear normal.  Nose: Nose normal.  Mouth/Throat: Oropharynx is clear and moist.  Eyes: Conjunctivae and EOM are normal. Pupils are equal, round, and reactive to light.  Neck: Normal range of motion. Neck supple.  Cardiovascular: Normal rate, regular rhythm, normal heart sounds and intact distal pulses.   Pulmonary/Chest: Effort normal and breath sounds normal.  Abdominal: Soft. Bowel sounds are normal. There is tenderness.  Musculoskeletal: Normal range of motion.  Neurological: He is alert and oriented to person, place, and time. He has normal reflexes.  Skin: Skin is warm and dry.  Psychiatric: He has a normal mood and affect. His behavior is normal. Judgment and thought content normal.       Assessment & Plan:  1. Gastroesophageal reflux disease without esophagitis Will start a 6 week trial of omeprazole for symptoms of GERD. Patient and I discussed diet plan at length. He was given written information. Patient expressed understanding. Patient will follow up in office in 6 weeks.   - omeprazole (PRILOSEC) 20 MG capsule; Take 1 capsule (20 mg  total) by mouth daily.  Dispense: 42 capsule; Refill: 0   Routine Health Maintenance:  Recommend prostates exam Recommend routine dental visit Recommend eye examination Patient refuses immunizations   RTC: 6 weeks for GERD   Hollis,Lachina M, FNP   The patient was given clear instructions to go to ER or return to medical center if symptoms do not improve,  worsen or new problems develop. The patient verbalized understanding. Will notify patient with laboratory results.

## 2015-11-19 NOTE — Progress Notes (Signed)
Patient here for right side pain  Patient complains of pain under his chest for 1 year. Pain increases with movement from left to right which causes dizziness. Pain is scaled currently at a 4. Pain is described as burning at the moment.

## 2015-11-25 ENCOUNTER — Other Ambulatory Visit: Payer: Self-pay

## 2015-11-25 DIAGNOSIS — K219 Gastro-esophageal reflux disease without esophagitis: Secondary | ICD-10-CM

## 2015-11-25 MED ORDER — OMEPRAZOLE 20 MG PO CPDR: 20.0000 mg | DELAYED_RELEASE_CAPSULE | Freq: Every day | ORAL | Status: DC

## 2015-11-25 MED FILL — ?OMEPRAZOLE DR 20 MG CAPSUL: 20 | 30 days supply | Qty: 30 | Fill #0

## 2015-11-25 NOTE — Telephone Encounter (Signed)
Medication [facility]. Thanks!

## 2016-01-14 ENCOUNTER — Telehealth: Payer: Self-pay

## 2016-01-14 DIAGNOSIS — K219 Gastro-esophageal reflux disease without esophagitis: Secondary | ICD-10-CM

## 2016-01-14 MED ORDER — OMEPRAZOLE 20 MG PO CPDR: 20.0000 mg | DELAYED_RELEASE_CAPSULE | Freq: Every day | ORAL | Status: DC

## 2016-01-14 NOTE — Telephone Encounter (Signed)
Refill for omeprazole sent into pharmacy. Thanks ! 

## 2016-01-14 NOTE — Telephone Encounter (Signed)
Pt is requesting a medication refill for his Prilosec. Thanks!

## 2016-01-14 NOTE — Telephone Encounter (Signed)
Pt is requesting a medication refill for his

## 2016-01-28 MED FILL — ?OMEPRAZOLE DR 20 MG CAPSUL: 20 | 30 days supply | Qty: 30 | Fill #0

## 2016-10-20 ENCOUNTER — Ambulatory Visit: Payer: No Typology Code available for payment source | Attending: Family Medicine

## 2016-11-03 ENCOUNTER — Ambulatory Visit: Payer: Self-pay | Admitting: Family Medicine

## 2016-11-10 ENCOUNTER — Encounter: Payer: Self-pay | Admitting: Family Medicine

## 2016-11-10 ENCOUNTER — Ambulatory Visit (HOSPITAL_COMMUNITY)
Admission: RE | Admit: 2016-11-10 | Discharge: 2016-11-10 | Disposition: A | Discharge status: Home / Self Care | Payer: No Typology Code available for payment source | Source: Ambulatory Visit | Attending: Family Medicine | Admitting: Family Medicine

## 2016-11-10 ENCOUNTER — Ambulatory Visit (INDEPENDENT_AMBULATORY_CARE_PROVIDER_SITE_OTHER): Payer: No Typology Code available for payment source | Admitting: Family Medicine

## 2016-11-10 ENCOUNTER — Encounter (HOSPITAL_COMMUNITY): Payer: Self-pay

## 2016-11-10 VITALS — BP 132/77 | HR 65 | Temp 98.5°F | Resp 14 | Ht 72.0 in | Wt 202.0 lb

## 2016-11-10 DIAGNOSIS — M25552 Pain in left hip: Secondary | ICD-10-CM

## 2016-11-10 DIAGNOSIS — E559 Vitamin D deficiency, unspecified: Secondary | ICD-10-CM

## 2016-11-10 DIAGNOSIS — R1010 Upper abdominal pain, unspecified: Secondary | ICD-10-CM

## 2016-11-10 DIAGNOSIS — E785 Hyperlipidemia, unspecified: Secondary | ICD-10-CM

## 2016-11-10 DIAGNOSIS — M2550 Pain in unspecified joint: Secondary | ICD-10-CM

## 2016-11-10 LAB — LIPID PANEL
Cholesterol: 208 mg/dL — ABNORMAL HIGH (ref <200)
HDL: 45 mg/dL (ref >40)
LDL CALC: 124 mg/dL — AB (ref <100)
Total CHOL/HDL Ratio: 4.6 Ratio (ref <5.0)
Triglycerides: 197 mg/dL — ABNORMAL HIGH (ref <150)
VLDL: 39 mg/dL — AB (ref <30)

## 2016-11-10 LAB — COMPLETE METABOLIC PANEL WITH GFR
ALBUMIN: 4.3 g/dL (ref 3.6–5.1)
ALT: 12 U/L (ref 9–46)
AST: 16 U/L (ref 10–35)
Alkaline Phosphatase: 44 U/L (ref 40–115)
BUN: 10 mg/dL (ref 7–25)
CO2: 28 mmol/L (ref 20–31)
CREATININE: 0.95 mg/dL (ref 0.70–1.33)
Calcium: 9.3 mg/dL (ref 8.6–10.3)
Chloride: 104 mmol/L (ref 98–110)
GFR, Est African American: >89 mL/min (ref >=60)
GFR, Est Non African American: >89 mL/min (ref >=60)
GLUCOSE: 85 mg/dL (ref 65–99)
Potassium: 4.2 mmol/L (ref 3.5–5.3)
SODIUM: 140 mmol/L (ref 135–146)
TOTAL PROTEIN: 6.8 g/dL (ref 6.1–8.1)
Total Bilirubin: 0.6 mg/dL (ref 0.2–1.2)

## 2016-11-10 LAB — POCT GLYCOSYLATED HEMOGLOBIN (HGB A1C): Hemoglobin A1C: 5.5

## 2016-11-10 NOTE — Progress Notes (Signed)
Subjective:    Patient ID: Jeffery Reyes, male    DOB: 04/17/1961, [age] y.o.   MRN: [id number]  Abdominal Pain  This is a chronic (Jeffery Reyes had an upper abdominal ultrasound in [place] several years ago that showed gallbladder sludge and has periodic upper abdominal pain) problem. The current episode started more than 1 year ago. The onset quality is undetermined. The problem occurs intermittently. The problem has been waxing and waning. The pain is located in the RUQ. The pain is at a severity of 0/10. The pain is mild. The quality of the pain is aching. The abdominal pain does not radiate. Associated symptoms include arthralgias and myalgias. Pertinent negatives include no anorexia, belching, constipation, diarrhea, dysuria, fever, flatus, frequency, headaches, hematochezia, hematuria, nausea, vomiting or weight loss. The pain is aggravated by eating. The pain is relieved by nothing. Prior diagnostic workup includes ultrasound. His past medical history is significant for GERD.  Hip Pain   There was no injury mechanism. The pain is present in the left hip. The pain is at a severity of 2/10. The pain is mild. The pain has been fluctuating since onset. Pertinent negatives include no inability to bear weight, loss of motion, loss of sensation, muscle weakness, numbness or tingling. The symptoms are aggravated by movement and weight bearing. He has tried nothing for the symptoms.    Past Medical History:  Diagnosis Date  . High cholesterol   . Kidney stones   . Proteinuria    Social History   Social History  . Marital status: Married    Spouse name: N/A  . Number of children: N/A  . Years of education: N/A   Occupational History  . Not on file.   Social History Main Topics  . Smoking status: Former Games developermoker  . Smokeless tobacco: Never Used  . Alcohol use No  . Drug use: No  . Sexual activity: Not on file   Other Topics Concern  . Not on file   Social History Narrative  . No narrative on  file   Immunization History  Administered Date(s) Administered  . Influenza,inj,Quad PF,36+ Mos 08/25/2014  . Tdap 08/25/2014   Review of Systems  Constitutional: Negative for fever and weight loss.  HENT: Negative.   Eyes: Negative for photophobia, pain, discharge, redness, itching and visual disturbance.  Cardiovascular: Negative.  Negative for chest pain, palpitations and leg swelling.  Gastrointestinal: Positive for abdominal pain. Negative for anorexia, constipation, diarrhea, flatus, hematochezia, nausea and vomiting.  Genitourinary: Negative for dysuria, frequency and hematuria.  Musculoskeletal: Positive for arthralgias, joint swelling and myalgias.  Neurological: Negative for tingling, numbness and headaches.  Hematological: Negative.   Psychiatric/Behavioral: Negative.        Objective:   Physical Exam  Constitutional: He is oriented to person, place, and time. He appears well-developed.  HENT:  Head: Normocephalic and atraumatic.  Right Ear: External ear normal.  Left Ear: External ear normal.  Mouth/Throat: Oropharynx is clear and moist.  Eyes: Conjunctivae and EOM are normal. Pupils are equal, round, and reactive to light.  Neck: Normal range of motion. Neck supple.  Cardiovascular: Normal rate, regular rhythm, normal heart sounds and intact distal pulses.   Pulmonary/Chest: Effort normal and breath sounds normal.  Abdominal: Soft. Bowel sounds are normal.  Musculoskeletal:       Left hip: He exhibits decreased range of motion, decreased strength and tenderness. He exhibits no swelling.       Right knee: He exhibits decreased range of motion.  Left knee: He exhibits decreased range of motion.  Neurological: He is alert and oriented to person, place, and time. He has normal reflexes.  Skin: Skin is warm and dry.  Psychiatric: He has a normal mood and affect. His behavior is normal. Judgment and thought content normal.      BP 132/77 (BP Location: Left Arm,  Patient Position: Sitting, Cuff Size: Normal)   Pulse 65   Temp 98.5 F (36.9 C) (Oral)   Resp 14   Ht 6' (1.829 m)   Wt 202 lb (91.6 kg)   SpO2 99%   BMI 27.40 kg/m  Assessment & Plan:  1. Upper abdominal pain Recommend a low fat diet divided over 5-6 small meals throughout the day - US Abdomen Complete; Future - COMPLETE METABOLIC PANEL WITH GFR  2. Left hip pain  - DG HIP UNILAT WITH PELVIS 2-3 VIEWS LEFT; Future  3. Arthralgia, unspecified joint  - Sedimentation Rate - C-reactive protein - Rheumatoid factor - DG HIP UNILAT WITH PELVIS 2-3 VIEWS LEFT; Future  4. Dyslipidemia - Lipid Panel - HgB A1c  5. Vitamin D deficiency - Vitamin D, 25-hydroxy   RTC: 3 months for chronic conditions   Danzig Macgregor Rennis Petty  MSN, FNP-C [facility] [address] [place], [place] [address] [phone]

## 2016-11-10 NOTE — Patient Instructions (Addendum)
Arthritis Arthritis is a term that is commonly used to refer to joint pain or joint disease. There are more than 100 types of arthritis. What are the causes? The most common cause of this condition is wear and tear of a joint. Other causes include:  Gout.  Inflammation of a joint.  An infection of a joint.  Sprains and other injuries near the joint.  A drug reaction or allergic reaction. In some cases, the cause may not be known. What are the signs or symptoms? The main symptom of this condition is pain in the joint with movement. Other symptoms include:  Redness, swelling, or stiffness at a joint.  Warmth coming from the joint.  Fever.  Overall feeling of illness. How is this diagnosed? This condition may be diagnosed with a physical exam and tests, including:  Blood tests.  Urine tests.  Imaging tests, such as MRI, X-rays, or a CT scan. Sometimes, fluid is removed from a joint for testing. How is this treated? Treatment for this condition may involve:  Treatment of the cause, if it is known.  Rest.  Raising (elevating) the joint.  Applying cold or hot packs to the joint.  Medicines to improve symptoms and reduce inflammation.  Injections of a steroid such as cortisone into the joint to help reduce pain and inflammation. Depending on the cause of your arthritis, you may need to make lifestyle changes to reduce stress on your joint. These changes may include exercising more and losing weight. Follow these instructions at home: Medicines   Take over-the-counter and prescription medicines only as told by your health care provider.  Do not take aspirin to relieve pain if gout is suspected. Activity   Rest your joint if told by your health care provider. Rest is important when your disease is active and your joint feels painful, swollen, or stiff.  Avoid activities that make the pain worse. It is important to balance activity with rest.  Exercise your joint  regularly with range-of-motion exercises as told by your health care provider. Try doing low-impact exercise, such as:  Swimming.  Water aerobics.  Biking.  Walking. Joint Care    If your joint is swollen, keep it elevated if told by your health care provider.  If your joint feels stiff in the morning, try taking a warm shower.  If directed, apply heat to the joint. If you have diabetes, do not apply heat without permission from your health care provider.  Put a towel between the joint and the hot pack or heating pad.  Leave the heat on the area for 20-30 minutes.  If directed, apply ice to the joint:  Put ice in a plastic bag.  Place a towel between your skin and the bag.  Leave the ice on for 20 minutes, 2-3 times per day.  Keep all follow-up visits as told by your health care provider. This is important. Contact a health care provider if:  The pain gets worse.  You have a fever. Get help right away if:  You develop severe joint pain, swelling, or redness.  Many joints become painful and swollen.  You develop severe back pain.  You develop severe weakness in your leg.  You cannot control your bladder or bowels. This information is not intended to replace advice given to you by your health care provider. Make sure you discuss any questions you have with your health care provider. Document Released: 09/14/2004 Document Revised: 01/13/2016 Document Reviewed: 11/02/2014 Elsevier Interactive Patient Education  2017 Elsevier Inc.  Joint Pain Joint pain can be caused by many things. The joint can be bruised, infected, weak from aging, or sore from exercise. The pain will probably go away if you follow your doctor's instructions for home care. If your joint pain continues, more tests may be needed to help find the cause of your condition. Follow these instructions at home: Watch your condition for any changes. Follow these instructions as told to lessen the pain that  you are feeling:  Take medicines only as told by your doctor.  Rest the sore joint for as long as told by your doctor. If your doctor tells you to, raise (elevate) the painful joint above the level of your heart while you are sitting or lying down.  Do not do things that cause pain or make the pain worse.  If told, put ice on the painful area:  Put ice in a plastic bag.  Place a towel between your skin and the bag.  Leave the ice on for 20 minutes, 2-3 times per day.  Wear an elastic bandage, splint, or sling as told by your doctor. Loosen the bandage or splint if your fingers or toes lose feeling (become numb) and tingle, or if they turn cold and blue.  Begin exercising or stretching the joint as told by your doctor. Ask your doctor what types of exercise are safe for you.  Keep all follow-up visits as told by your doctor. This is important. Contact a doctor if:  Your pain gets worse and medicine does not help it.  Your joint pain does not get better in 3 days.  You have more bruising or swelling.  You have a fever.  You lose 10 pounds (4.5 kg) or more without trying. Get help right away if:  You are not able to move the joint.  Your fingers or toes become numb or they turn cold and blue. This information is not intended to replace advice given to you by your health care provider. Make sure you discuss any questions you have with your health care provider. Document Released: 07/26/2009 Document Revised: 01/13/2016 Document Reviewed: 05/19/2014 Elsevier Interactive Patient Education  2017 [employer]

## 2016-11-11 DIAGNOSIS — M25552 Pain in left hip: Secondary | ICD-10-CM | POA: Insufficient documentation

## 2016-11-11 DIAGNOSIS — R1010 Upper abdominal pain, unspecified: Secondary | ICD-10-CM | POA: Insufficient documentation

## 2016-11-11 DIAGNOSIS — M2550 Pain in unspecified joint: Secondary | ICD-10-CM | POA: Insufficient documentation

## 2016-11-11 LAB — VITAMIN D 25 HYDROXY (VIT D DEFICIENCY, FRACTURES): VIT D 25 HYDROXY: 27 ng/mL — AB (ref 30–100)

## 2016-11-11 LAB — SEDIMENTATION RATE: Sed Rate: 1 mm/hr (ref 0–20)

## 2016-11-13 LAB — RHEUMATOID FACTOR: Rhuematoid fact SerPl-aCnc: <14 IU/mL (ref <14)

## 2016-11-13 LAB — C-REACTIVE PROTEIN: CRP: 0.6 mg/L (ref <8.0)

## 2016-11-15 ENCOUNTER — Ambulatory Visit (HOSPITAL_COMMUNITY)
Admission: RE | Admit: 2016-11-15 | Discharge: 2016-11-15 | Disposition: A | Discharge status: Home / Self Care | Payer: No Typology Code available for payment source | Source: Ambulatory Visit | Attending: Family Medicine | Admitting: Family Medicine

## 2016-11-15 ENCOUNTER — Other Ambulatory Visit: Payer: Self-pay | Admitting: Family Medicine

## 2016-11-15 DIAGNOSIS — M24051 Loose body in right hip: Secondary | ICD-10-CM

## 2016-11-15 DIAGNOSIS — E785 Hyperlipidemia, unspecified: Secondary | ICD-10-CM

## 2016-11-15 DIAGNOSIS — R1010 Upper abdominal pain, unspecified: Secondary | ICD-10-CM | POA: Insufficient documentation

## 2016-11-15 MED ORDER — ATORVASTATIN CALCIUM 20 MG PO TABS: 20.0000 mg | ORAL_TABLET | Freq: Every day | ORAL | 3 refills | Status: DC

## 2016-11-15 MED ORDER — ASPIRIN EC 81 MG PO TBEC
81.0000 mg | DELAYED_RELEASE_TABLET | Freq: Every day | ORAL | 11 refills | Status: DC
Start: 2016-11-15 — End: 2020-10-12

## 2016-11-15 MED FILL — ATORVASTATIN 20 MG TABLET: 20 | 30 days supply | Qty: 30 | Fill #0

## 2016-11-15 NOTE — Progress Notes (Signed)
Reviewed images, loose body to right hip. Since loose body is symptomatic, will send a referral to orthopedic specialist for further workup and evaluation.   Patient has elevated cholesterol levels. Will start statin therapy and Aspirin 81 mg daily Meds ordered this encounter  Medications  . atorvastatin (LIPITOR) 20 MG tablet    Sig: Take 1 tablet (20 mg total) by mouth daily.    Dispense:  90 tablet    Refill:  3  . aspirin EC 81 MG tablet    Sig: Take 1 tablet (81 mg total) by mouth daily.    Dispense:  30 tablet    Refill:  11    [place] Rennis PettyMoore Hollis  MSN, FNP-C [facility] [address]  [place], [place] [id number] [phone]

## 2016-12-06 ENCOUNTER — Ambulatory Visit (INDEPENDENT_AMBULATORY_CARE_PROVIDER_SITE_OTHER): Payer: Self-pay | Admitting: Family Medicine

## 2016-12-06 VITALS — BP 131/76 | Ht 73.23 in | Wt 195.0 lb

## 2016-12-06 DIAGNOSIS — M25552 Pain in left hip: Secondary | ICD-10-CM

## 2016-12-06 MED ORDER — GABAPENTIN 300 MG PO CAPS: 300.0000 mg | ORAL_CAPSULE | Freq: Every day | ORAL | 2 refills | Status: DC

## 2016-12-06 MED FILL — GABAPENTIN 300 MG CAPSULE: 300 | 90 days supply | Qty: 90 | Fill #0

## 2016-12-06 NOTE — Progress Notes (Signed)
  [name] - 56 y.o. male MRN [id number]  Date of birth: 08-01-1961  SUBJECTIVE:  Including CC & ROS.   An interpreter was used during the entirety of this appointment.  Jeffery Reyes is a 56 year old man that is presenting with left buttock pain. This pain started about 2 years ago. He denies any injury. He has pain with sleeping on that side. He feels like there is a heaviness in his quadricep. He has had some radicular symptoms previously. He has not tried much in terms of therapy. He has pain mainly at night. He also has pain while he is driving. The radicular symptoms go down to his knee area denies any numbness or tingling.   ROS: No unexpected weight loss, fever, chills, swelling, instability,  numbness/tingling, redness, otherwise see HPI    HISTORY: Past Medical, Surgical, Social, and Family History Reviewed & Updated per EMR.   Pertinent Historical Findings include: PMSHx -  Hyperlipidemia, left wrist Tunnel release, left thumb trigger finger release PSHx -   former tobacco use, no alcohol use FHx - coronary artery disease, hypertension, hyperlipidemia  DATA REVIEWED: 11/10/16: left hip x-ray: Possible hip joint loose body.  No other focal abnormality is noted.  PHYSICAL EXAM:  VS: BP:131/76  HR: bpm  TEMP: ( )  RESP:   HT:6' 1.23" (186 cm)   WT:195 lb (88.5 kg)  BMI:25.6 PHYSICAL EXAM: Gen: NAD, alert, cooperative with exam, well-appearing HEENT: clear conjunctiva, EOMI CV:  no edema, capillary refill brisk,  Resp: non-labored, normal speech Skin: no rashes, normal turgor  Neuro: no gross deficits.  Psych:  alert and oriented MSK:  Back/hip:  Some tenderness to palpation over the piriformis. No tenderness to palpation of the paraspinal or lumbar spine No tender to palpation over the SI joints or greater trochanter. Some weakness with hip abduction. Normal internal and external rotation of the hip. Normal strength hip flexion. Normal knee flexion and extension. Deep  tendon reflexes are symmetrical at patella Negative straight leg raise bilaterally. Normal FADIR and FABER Neurovascularly intact.  ASSESSMENT & PLAN:   Left hip pain It appears that he is having possible piriformis syndrome versus radicular symptoms from his back. - Initiate gabapentin - Provided home exercises - Follow-up in 4-6 weeks. If no improvement can consider formal physical therapy versus obtaining an MRI

## 2016-12-06 NOTE — Patient Instructions (Signed)
Thank you for coming in,   I am starting you on a nerve medicine called gabapentin. Start at 300 mg at night.   You can increase this medicine to 300 mg in the morning and at night in two weeks if your pain hasn't improved.   Please follow up with me in 4-6 weeks.    Please feel free to call with any questions or concerns at any time, at [phone]. --Dr. Jordan Likes

## 2016-12-07 NOTE — Assessment & Plan Note (Signed)
It appears that he is having possible piriformis syndrome versus radicular symptoms from his back. - Initiate gabapentin - Provided home exercises - Follow-up in 4-6 weeks. If no improvement can consider formal physical therapy versus obtaining an MRI

## 2016-12-12 MED FILL — ATORVASTATIN 20 MG TABLET: 20 | 90 days supply | Qty: 90 | Fill #1

## 2017-01-03 ENCOUNTER — Ambulatory Visit: Payer: No Typology Code available for payment source | Admitting: Family Medicine

## 2017-03-21 MED FILL — ATORVASTATIN 20 MG TABLET: 20 | 90 days supply | Qty: 90 | Fill #2

## 2017-06-15 MED FILL — GABAPENTIN 300 MG CAPSULE: 300 | 90 days supply | Qty: 90 | Fill #1

## 2017-06-15 MED FILL — ATORVASTATIN 20 MG TABLET: 20 | 90 days supply | Qty: 90 | Fill #3

## 2017-10-24 MED FILL — ATORVASTATIN 20 MG TABLET: 20 | 60 days supply | Qty: 60 | Fill #4

## 2017-10-24 MED FILL — GABAPENTIN 300 MG CAPSULE: 300 | 90 days supply | Qty: 90 | Fill #2

## 2019-06-06 DIAGNOSIS — G5601 Carpal tunnel syndrome, right upper limb: Secondary | ICD-10-CM | POA: Insufficient documentation

## 2020-02-19 DIAGNOSIS — E559 Vitamin D deficiency, unspecified: Secondary | ICD-10-CM

## 2020-02-19 DIAGNOSIS — K219 Gastro-esophageal reflux disease without esophagitis: Secondary | ICD-10-CM

## 2020-02-19 DIAGNOSIS — R3915 Urgency of urination: Secondary | ICD-10-CM

## 2020-02-19 DIAGNOSIS — H547 Unspecified visual loss: Secondary | ICD-10-CM

## 2020-02-19 DIAGNOSIS — G629 Polyneuropathy, unspecified: Secondary | ICD-10-CM

## 2020-02-19 DIAGNOSIS — E7800 Pure hypercholesterolemia, unspecified: Secondary | ICD-10-CM

## 2020-02-19 HISTORY — DX: Pure hypercholesterolemia, unspecified: E78.00

## 2020-02-19 HISTORY — DX: Polyneuropathy, unspecified: G62.9

## 2020-02-19 HISTORY — DX: Urgency of urination: R39.15

## 2020-02-19 HISTORY — DX: Unspecified visual loss: H54.7

## 2020-02-19 HISTORY — DX: Vitamin D deficiency, unspecified: E55.9

## 2020-02-19 HISTORY — DX: Gastro-esophageal reflux disease without esophagitis: K21.9

## 2020-02-24 ENCOUNTER — Ambulatory Visit: Payer: No Typology Code available for payment source | Admitting: Family Medicine

## 2020-03-01 ENCOUNTER — Other Ambulatory Visit: Payer: Self-pay

## 2020-03-01 ENCOUNTER — Ambulatory Visit (INDEPENDENT_AMBULATORY_CARE_PROVIDER_SITE_OTHER): Payer: No Typology Code available for payment source | Admitting: Family Medicine

## 2020-03-01 ENCOUNTER — Encounter: Payer: Self-pay | Admitting: Family Medicine

## 2020-03-01 VITALS — BP 135/78 | HR 75 | Temp 98.2°F | Resp 16 | Wt 205.4 lb

## 2020-03-01 DIAGNOSIS — H547 Unspecified visual loss: Secondary | ICD-10-CM | POA: Insufficient documentation

## 2020-03-01 DIAGNOSIS — K219 Gastro-esophageal reflux disease without esophagitis: Secondary | ICD-10-CM

## 2020-03-01 DIAGNOSIS — G629 Polyneuropathy, unspecified: Secondary | ICD-10-CM

## 2020-03-01 DIAGNOSIS — R739 Hyperglycemia, unspecified: Secondary | ICD-10-CM

## 2020-03-01 DIAGNOSIS — R3915 Urgency of urination: Secondary | ICD-10-CM | POA: Insufficient documentation

## 2020-03-01 DIAGNOSIS — Z0000 Encounter for general adult medical examination without abnormal findings: Secondary | ICD-10-CM

## 2020-03-01 DIAGNOSIS — Z09 Encounter for follow-up examination after completed treatment for conditions other than malignant neoplasm: Secondary | ICD-10-CM

## 2020-03-01 DIAGNOSIS — R7303 Prediabetes: Secondary | ICD-10-CM | POA: Insufficient documentation

## 2020-03-01 DIAGNOSIS — Z7689 Persons encountering health services in other specified circumstances: Secondary | ICD-10-CM

## 2020-03-01 LAB — POCT URINALYSIS DIPSTICK
Blood, UA: NEGATIVE
Glucose, UA: NEGATIVE
Ketones, UA: NEGATIVE
Leukocytes, UA: NEGATIVE
Nitrite, UA: NEGATIVE
Protein, UA: POSITIVE — AB
Spec Grav, UA: >=1.03 — AB (ref 1.010–1.025)
Urobilinogen, UA: 1 E.U./dL
pH, UA: 6.5 (ref 5.0–8.0)

## 2020-03-01 LAB — POCT GLYCOSYLATED HEMOGLOBIN (HGB A1C)
HbA1c POC (<> result, manual entry): 5.6 % (ref 4.0–5.6)
HbA1c, POC (controlled diabetic range): 5.6 % (ref 0.0–7.0)
HbA1c, POC (prediabetic range): 5.6 % — AB (ref 5.7–6.4)
Hemoglobin A1C: 5.6 % (ref 4.0–5.6)

## 2020-03-01 LAB — GLUCOSE, POCT (MANUAL RESULT ENTRY): POC Glucose: 116 mg/dl — AB (ref 70–99)

## 2020-03-01 NOTE — Progress Notes (Signed)
[facility]   New Patient--Hospital Follow Up--Re-Establish Care  Subjective:  Patient ID: Jeffery Reyes, male    DOB: Jun 11, 1961  Age: 59 y.o. MRN: [id number]  CC:  Chief Complaint  Patient presents with  . Pre-visit Screening Tool Documentation    Pt states he is here because he feels heat under his feetX2 YRS Pt also states he is urinated more than usual , he states he sweats alot, and he feels his R gand is tciking  and it wakes him oyt his sleep. Pt states his toe next to the big toe is numbX2 yrs.    HPI Jeffery Reyes is a 59 year old male who presents to Re-establish Care today.    Patient Active Problem List   Diagnosis Date Noted  . Urinary urgency 03/01/2020  . Hyperglycemia 03/01/2020  . Vision problems 03/01/2020  . Gastroesophageal reflux disease without esophagitis 03/01/2020  . Upper abdominal pain 11/11/2016  . Left hip pain 11/11/2016  . Arthralgia 11/11/2016  . Dyslipidemia 09/23/2014  . Vitamin D deficiency 09/04/2014  . Neutropenia (HCC) 09/04/2014   Current Status: This will be Jeffery Reyes's initial office visit with me. He was previously seeing Armeniahina Hollis, NP at our office for his PCP needs. Since his last office visit, he reports joint pain and numbness often. He has occasional upper right quadrant abdominal pain. He denies fevers, chills, fatigue, recent infections, weight loss, and night sweats. He has not had any headaches, dizziness, and falls. No chest pain, heart palpitations, cough and shortness of breath reported. No reports of GI problems such as nausea, vomiting, diarrhea, and constipation. He has no reports of blood in stools, dysuria and hematuria. No depression or anxiety, and denies suicidal ideations, homicidal ideations, or auditory hallucinations. He is taking all medications as prescribed.   Past Medical History:  Diagnosis Date  . GERD (gastroesophageal reflux disease) 02/2020  . High  cholesterol   . Kidney stones   . Peripheral neuropathy 02/2020  . Proteinuria   . Urinary urgency 02/2020  . Visual problems 02/2020    Past Surgical History:  Procedure Laterality Date  . nerve release hand Left 2015  . REPAIR / RECONSTRUCTION COLLATERAL LIGAMENT HAND Left     Family History  Problem Relation Age of Onset  . Hypertension Father   . Stroke Brother     Social History   Socioeconomic History  . Marital status: Married    Spouse name: Not on file  . Number of children: Not on file  . Years of education: Not on file  . Highest education level: Not on file  Occupational History  . Not on file  Tobacco Use  . Smoking status: Former Games developermoker  . Smokeless tobacco: Never Used  Substance and Sexual Activity  . Alcohol use: No    Alcohol/week: 0.0 standard drinks  . Drug use: No  . Sexual activity: Not on file  Other Topics Concern  . Not on file  Social History Narrative  . Not on file   Social Determinants of Health   Financial Resource Strain:   . Difficulty of Paying Living Expenses:   Food Insecurity:   . Worried About Programme researcher, broadcasting/film/videounning Out of Food in the Last Year:   . Baristaan Out of Food in the Last Year:   Transportation Needs:   . Freight forwarderLack of Transportation (Medical):   Marland Kitchen. Lack of Transportation (Non-Medical):   Physical Activity:   . Days of Exercise per  Week:   . Minutes of Exercise per Session:   Stress:   . Feeling of Stress :   Social Connections:   . Frequency of Communication with Friends and Family:   . Frequency of Social Gatherings with Friends and Family:   . Attends Religious Services:   . Active Member of Clubs or Organizations:   . Attends Banker Meetings:   Marland Kitchen Marital Status:   Intimate Partner Violence:   . Fear of Current or Ex-Partner:   . Emotionally Abused:   Marland Kitchen Physically Abused:   . Sexually Abused:     Outpatient Medications Prior to Visit  Medication Sig Dispense Refill  . aspirin EC 81 MG tablet Take 1 tablet  (81 mg total) by mouth daily. (Patient not taking: Reported on 03/01/2020) 30 tablet 11  . atorvastatin (LIPITOR) 20 MG tablet Take 1 tablet (20 mg total) by mouth daily. (Patient not taking: Reported on 03/01/2020) 90 tablet 3  . Cholecalciferol (VITAMIN D) 2000 units CAPS Take by mouth. (Patient not taking: Reported on 03/01/2020)    . gabapentin (NEURONTIN) 300 MG capsule Take 1 capsule (300 mg total) by mouth at bedtime. (Patient not taking: Reported on 03/01/2020) 90 capsule 2  . omeprazole (PRILOSEC) 20 MG capsule Take 1 capsule (20 mg total) by mouth daily. (Patient not taking: Reported on 11/10/2016) 42 capsule 0   No facility-administered medications prior to visit.    No Known Allergies  ROS Review of Systems  Constitutional: Negative.   HENT: Negative.   Eyes: Negative.   Respiratory: Negative.   Cardiovascular: Negative.   Gastrointestinal: Positive for abdominal pain (occasional; upper right quadrant).  Endocrine: Negative.   Genitourinary: Negative.   Musculoskeletal: Negative.   Skin: Negative.   Allergic/Immunologic: Negative.   Neurological: Positive for dizziness (occasional) and numbness (generalized peripheral neuropathy).  Hematological: Negative.   Psychiatric/Behavioral: Negative.     Objective:    Physical Exam Vitals and nursing note reviewed.  Constitutional:      Appearance: Normal appearance.  HENT:     Head: Normocephalic and atraumatic.     Nose: Nose normal.     Mouth/Throat:     Mouth: Mucous membranes are dry.  Cardiovascular:     Rate and Rhythm: Normal rate and regular rhythm.     Pulses: Normal pulses.     Heart sounds: Normal heart sounds.  Pulmonary:     Effort: Pulmonary effort is normal.     Breath sounds: Normal breath sounds.  Abdominal:     General: Bowel sounds are normal.     Palpations: Abdomen is soft.  Musculoskeletal:        General: Normal range of motion.     Cervical back: Normal range of motion and neck supple.   Skin:    General: Skin is warm and dry.     Capillary Refill: Capillary refill takes 2 to 3 seconds.  Neurological:     General: No focal deficit present.     Mental Status: He is alert and oriented to person, place, and time.  Psychiatric:        Mood and Affect: Mood normal.        Behavior: Behavior normal.        Thought Content: Thought content normal.        Judgment: Judgment normal.     BP 135/78 (BP Location: Left Arm, Patient Position: Sitting, Cuff Size: Normal)   Pulse 75   Temp 98.2 F (36.8 C)  Resp 16   Wt 205 lb 6.4 oz (93.2 kg)   SpO2 100%   BMI 26.93 kg/m  Wt Readings from Last 3 Encounters:  03/01/20 205 lb 6.4 oz (93.2 kg)  12/06/16 195 lb (88.5 kg)  11/10/16 202 lb (91.6 kg)     Health Maintenance Due  Topic Date Due  . Hepatitis C Screening  Never done  . COVID-19 Vaccine (1) Never done  . COLONOSCOPY  Never done    There are no preventive care reminders to display for this patient.  No results found for: TSH Lab Results  Component Value Date   WBC 3.2 (L) 09/01/2014   HGB 15.2 09/01/2014   HCT 43.5 09/01/2014   MCV 86.3 09/01/2014   PLT 206 09/01/2014   Lab Results  Component Value Date   NA 140 11/10/2016   K 4.2 11/10/2016   CO2 28 11/10/2016   GLUCOSE 85 11/10/2016   BUN 10 11/10/2016   CREATININE 0.95 11/10/2016   BILITOT 0.6 11/10/2016   ALKPHOS 44 11/10/2016   AST 16 11/10/2016   ALT 12 11/10/2016   PROT 6.8 11/10/2016   ALBUMIN 4.3 11/10/2016   CALCIUM 9.3 11/10/2016   Lab Results  Component Value Date   CHOL 208 (H) 11/10/2016   Lab Results  Component Value Date   HDL 45 11/10/2016   Lab Results  Component Value Date   LDLCALC 124 (H) 11/10/2016   Lab Results  Component Value Date   TRIG 197 (H) 11/10/2016   Lab Results  Component Value Date   CHOLHDL 4.6 11/10/2016   Lab Results  Component Value Date   HGBA1C 5.5 11/10/2016    Assessment & Plan:   1. Encounter to establish care - Urinalysis  Dipstick - POC HgB A1c - POC Glucose (CBG)  2. Urinary urgency We will assess PSA today.   3. Hyperglycemia  4. Vision problems  5. Gastroesophageal reflux disease without esophagitis Results are pending.  - H. pylori breath test  6. Neuropathy Stable. Not worsening.   7. Healthcare maintenance - CBC with Differential - Comprehensive metabolic panel - Lipid Panel - TSH - PSA - Vitamin B12 - Vitamin D, 25-hydroxy  8. Follow up He will follow up in 1 month.   No orders of the defined types were placed in this encounter.   Orders Placed This Encounter  Procedures  . CBC with Differential  . Comprehensive metabolic panel  . Lipid Panel  . TSH  . PSA  . Vitamin B12  . Vitamin D, 25-hydroxy  . H. pylori breath test  . Urinalysis Dipstick  . POC HgB A1c  . POC Glucose (CBG)    Referral Orders  No referral(s) requested today    Raliegh Ip,  MSN, FNP-BC [facility] Medicine/[facility] Cell [facility] [address] [place], [place] [address] [phone] fax   Problem List Items Addressed This Visit      Digestive   Gastroesophageal reflux disease without esophagitis   Relevant Orders   H. pylori breath test     Other   Hyperglycemia   Urinary urgency   Vision problems    Other Visit Diagnoses    Encounter to establish care    -  Primary   Relevant Orders   Urinalysis Dipstick   POC HgB A1c   POC Glucose (CBG)   Neuropathy       Healthcare maintenance       Relevant  Orders   CBC with Differential   Comprehensive metabolic panel   Lipid Panel   TSH   PSA   Vitamin B12   Vitamin D, 25-hydroxy   Follow up          No orders of the defined types were placed in this encounter.   Follow-up: Return in about 1 month (around 04/01/2020).    Kallie Locks, FNP

## 2020-03-02 ENCOUNTER — Encounter: Payer: Self-pay | Admitting: Family Medicine

## 2020-03-02 LAB — LIPID PANEL
Chol/HDL Ratio: 4 ratio (ref 0.0–5.0)
Cholesterol, Total: 202 mg/dL — ABNORMAL HIGH (ref 100–199)
HDL: 51 mg/dL (ref >39)
LDL Chol Calc (NIH): 127 mg/dL — ABNORMAL HIGH (ref 0–99)
Triglycerides: 137 mg/dL (ref 0–149)
VLDL Cholesterol Cal: 24 mg/dL (ref 5–40)

## 2020-03-02 LAB — CBC WITH DIFFERENTIAL/PLATELET
Basophils Absolute: 0 10*3/uL (ref 0.0–0.2)
Basos: 1 %
EOS (ABSOLUTE): 0.1 10*3/uL (ref 0.0–0.4)
Eos: 3 %
Hematocrit: 45.4 % (ref 37.5–51.0)
Hemoglobin: 15.9 g/dL (ref 13.0–17.7)
Immature Grans (Abs): 0 10*3/uL (ref 0.0–0.1)
Immature Granulocytes: 0 %
Lymphocytes Absolute: 1.4 10*3/uL (ref 0.7–3.1)
Lymphs: 47 %
MCH: 31.2 pg (ref 26.6–33.0)
MCHC: 35 g/dL (ref 31.5–35.7)
MCV: 89 fL (ref 79–97)
Monocytes Absolute: 0.3 10*3/uL (ref 0.1–0.9)
Monocytes: 10 %
Neutrophils Absolute: 1.2 10*3/uL — ABNORMAL LOW (ref 1.4–7.0)
Neutrophils: 39 %
Platelets: 219 10*3/uL (ref 150–450)
RBC: 5.1 x10E6/uL (ref 4.14–5.80)
RDW: 12.6 % (ref 11.6–15.4)
WBC: 3.1 10*3/uL — ABNORMAL LOW (ref 3.4–10.8)

## 2020-03-02 LAB — COMPREHENSIVE METABOLIC PANEL
ALT: 12 IU/L (ref 0–44)
AST: 16 IU/L (ref 0–40)
Albumin/Globulin Ratio: 2 (ref 1.2–2.2)
Albumin: 4.5 g/dL (ref 3.8–4.9)
Alkaline Phosphatase: 55 IU/L (ref 48–121)
BUN/Creatinine Ratio: 18 (ref 9–20)
BUN: 16 mg/dL (ref 6–24)
Bilirubin Total: 0.4 mg/dL (ref 0.0–1.2)
CO2: 23 mmol/L (ref 20–29)
Calcium: 9.5 mg/dL (ref 8.7–10.2)
Chloride: 105 mmol/L (ref 96–106)
Creatinine, Ser: 0.91 mg/dL (ref 0.76–1.27)
GFR calc Af Amer: 108 mL/min/{1.73_m2} (ref >59)
GFR calc non Af Amer: 93 mL/min/{1.73_m2} (ref >59)
Globulin, Total: 2.3 g/dL (ref 1.5–4.5)
Glucose: 96 mg/dL (ref 65–99)
Potassium: 4.1 mmol/L (ref 3.5–5.2)
Sodium: 141 mmol/L (ref 134–144)
Total Protein: 6.8 g/dL (ref 6.0–8.5)

## 2020-03-02 LAB — TSH: TSH: 2.2 u[IU]/mL (ref 0.450–4.500)

## 2020-03-02 LAB — PSA: Prostate Specific Ag, Serum: 0.5 ng/mL (ref 0.0–4.0)

## 2020-03-02 LAB — VITAMIN B12: Vitamin B-12: 354 pg/mL (ref 232–1245)

## 2020-03-02 LAB — VITAMIN D 25 HYDROXY (VIT D DEFICIENCY, FRACTURES): Vit D, 25-Hydroxy: 18.9 ng/mL — ABNORMAL LOW (ref 30.0–100.0)

## 2020-03-04 ENCOUNTER — Other Ambulatory Visit: Payer: Self-pay | Admitting: Family Medicine

## 2020-03-04 DIAGNOSIS — K219 Gastro-esophageal reflux disease without esophagitis: Secondary | ICD-10-CM

## 2020-03-04 LAB — H. PYLORI BREATH TEST: H pylori Breath Test: NEGATIVE

## 2020-03-04 MED ORDER — OMEPRAZOLE 20 MG PO CPDR: 20.0000 mg | DELAYED_RELEASE_CAPSULE | Freq: Every day | ORAL | 3 refills | Status: DC

## 2020-03-05 MED FILL — OMEPRAZOLE 20 MG CAP: 20 | 30 days supply | Qty: 30 | Fill #0

## 2020-03-09 ENCOUNTER — Ambulatory Visit: Payer: No Typology Code available for payment source | Admitting: Family Medicine

## 2020-03-09 ENCOUNTER — Other Ambulatory Visit: Payer: Self-pay

## 2020-03-11 NOTE — Progress Notes (Signed)
Pt was called @ 3:53 pm  to discuss his lab results. Pt stated that he he understood and will keep his f/u appt.

## 2020-04-02 ENCOUNTER — Encounter: Payer: Self-pay | Admitting: Family Medicine

## 2020-04-02 ENCOUNTER — Other Ambulatory Visit: Payer: Self-pay

## 2020-04-02 ENCOUNTER — Ambulatory Visit (INDEPENDENT_AMBULATORY_CARE_PROVIDER_SITE_OTHER): Payer: No Typology Code available for payment source | Admitting: Family Medicine

## 2020-04-02 VITALS — BP 127/64 | HR 64 | Temp 98.1°F | Resp 16 | Ht 71.0 in | Wt 209.8 lb

## 2020-04-02 DIAGNOSIS — R3915 Urgency of urination: Secondary | ICD-10-CM

## 2020-04-02 DIAGNOSIS — K219 Gastro-esophageal reflux disease without esophagitis: Secondary | ICD-10-CM

## 2020-04-02 DIAGNOSIS — R309 Painful micturition, unspecified: Secondary | ICD-10-CM

## 2020-04-02 DIAGNOSIS — J302 Other seasonal allergic rhinitis: Secondary | ICD-10-CM

## 2020-04-02 DIAGNOSIS — Z09 Encounter for follow-up examination after completed treatment for conditions other than malignant neoplasm: Secondary | ICD-10-CM

## 2020-04-02 DIAGNOSIS — E785 Hyperlipidemia, unspecified: Secondary | ICD-10-CM

## 2020-04-02 DIAGNOSIS — G6289 Other specified polyneuropathies: Secondary | ICD-10-CM

## 2020-04-02 MED ORDER — OMEPRAZOLE 20 MG PO CPDR: 20.0000 mg | DELAYED_RELEASE_CAPSULE | Freq: Every day | ORAL | 3 refills | Status: DC

## 2020-04-02 MED ORDER — ATORVASTATIN CALCIUM 20 MG PO TABS: 20.0000 mg | ORAL_TABLET | Freq: Every day | ORAL | 3 refills | Status: DC

## 2020-04-02 MED ORDER — OXYBUTYNIN CHLORIDE ER 5 MG PO TB24: 5.0000 mg | ORAL_TABLET | Freq: Every day | ORAL | 3 refills | Status: DC

## 2020-04-02 MED ORDER — CETIRIZINE HCL 10 MG PO TABS: 10.0000 mg | ORAL_TABLET | Freq: Every day | ORAL | 3 refills | Status: DC

## 2020-04-02 MED ORDER — GABAPENTIN 300 MG PO CAPS: 300.0000 mg | ORAL_CAPSULE | Freq: Every day | ORAL | 3 refills | Status: DC

## 2020-04-02 NOTE — Progress Notes (Signed)
[facility]    Established Patient Office Visit  Subjective:  Patient ID: Jeffery Reyes, male    DOB: February 24, 1961  Age: 59 y.o. MRN: [id number]  CC:  Chief Complaint  Patient presents with  . Follow-up    Pt states he is here for 1 f/u. Pt states he would like to discuss numbing R toes.   . Chest Pain    HPI Jeffery Reyes is a 59 year old is a 59 year old male who presents for Follow Up today.   Past Medical History:  Diagnosis Date  . Elevated cholesterol 02/2020  . GERD (gastroesophageal reflux disease) 02/2020  . High cholesterol   . Kidney stones   . Peripheral neuropathy 02/2020  . Proteinuria   . Urinary urgency 02/2020  . Visual problems 02/2020  . Vitamin D deficiency 02/2020    Past Surgical History:  Procedure Laterality Date  . nerve release hand Left 2015  . REPAIR / RECONSTRUCTION COLLATERAL LIGAMENT HAND Left     Family History  Problem Relation Age of Onset  . Hypertension Father   . Stroke Brother    Current Status: Since his last office visit, he is doing fairly well with no complaints. He has c/o urinary urgency. She denies urinary frequency, discharge, dysuria, urinary itching, burning, odor, hematuria, and suprapubic pain/discomfort. He denies fevers, chills, fatigue, recent infections, weight loss, and night sweats. He has not had any headaches, visual changes, dizziness, and falls. No chest pain, heart palpitations, cough and shortness of breath reported. Denies GI problems such as nausea, vomiting, diarrhea, and constipation. He has no reports of blood in stools, dysuria and hematuria. No depression or anxiety reported.  He is taking all medications as prescribed. He denies pain today.   Social History   Socioeconomic History  . Marital status: Married    Spouse name: Not on file  . Number of children: Not on file  . Years of education: Not on file  . Highest education level: Not on file   Occupational History  . Not on file  Tobacco Use  . Smoking status: Former Games developer  . Smokeless tobacco: Never Used  Substance and Sexual Activity  . Alcohol use: No    Alcohol/week: 0.0 standard drinks  . Drug use: No  . Sexual activity: Not on file  Other Topics Concern  . Not on file  Social History Narrative  . Not on file   Social Determinants of Health   Financial Resource Strain:   . Difficulty of Paying Living Expenses:   Food Insecurity:   . Worried About Programme researcher, broadcasting/film/video in the Last Year:   . Barista in the Last Year:   Transportation Needs:   . Freight forwarder (Medical):   Marland Kitchen Lack of Transportation (Non-Medical):   Physical Activity:   . Days of Exercise per Week:   . Minutes of Exercise per Session:   Stress:   . Feeling of Stress :   Social Connections:   . Frequency of Communication with Friends and Family:   . Frequency of Social Gatherings with Friends and Family:   . Attends Religious Services:   . Active Member of Clubs or Organizations:   . Attends Banker Meetings:   Marland Kitchen Marital Status:   Intimate Partner Violence:   . Fear of Current or Ex-Partner:   . Emotionally Abused:   Marland Kitchen Physically Abused:   . Sexually Abused:  Outpatient Medications Prior to Visit  Medication Sig Dispense Refill  . Cholecalciferol (VITAMIN D) 2000 units CAPS Take by mouth.     Marland Kitchen aspirin EC 81 MG tablet Take 1 tablet (81 mg total) by mouth daily. (Patient not taking: Reported on 03/01/2020) 30 tablet 11  . atorvastatin (LIPITOR) 20 MG tablet Take 1 tablet (20 mg total) by mouth daily. (Patient not taking: Reported on 03/01/2020) 90 tablet 3  . gabapentin (NEURONTIN) 300 MG capsule Take 1 capsule (300 mg total) by mouth at bedtime. (Patient not taking: Reported on 03/01/2020) 90 capsule 2  . omeprazole (PRILOSEC) 20 MG capsule Take 1 capsule (20 mg total) by mouth daily. (Patient not taking: Reported on 04/02/2020) 30 capsule 3   No  facility-administered medications prior to visit.    No Known Allergies  ROS Review of Systems  Constitutional: Negative.   HENT: Negative.   Eyes: Negative.   Respiratory: Negative.   Cardiovascular: Negative.   Gastrointestinal: Negative.   Endocrine: Negative.   Genitourinary: Positive for urgency.  Musculoskeletal: Negative.   Skin: Negative.   Allergic/Immunologic: Negative.   Neurological: Positive for dizziness (occasional ) and headaches (occasional).  Hematological: Negative.   Psychiatric/Behavioral: Negative.       Objective:    Physical Exam Vitals and nursing note reviewed.  Constitutional:      Appearance: Normal appearance. He is well-developed.  HENT:     Head: Normocephalic and atraumatic.     Nose: Nose normal.     Mouth/Throat:     Mouth: Mucous membranes are moist.     Pharynx: Oropharynx is clear.  Cardiovascular:     Rate and Rhythm: Normal rate and regular rhythm.     Pulses: Normal pulses.     Heart sounds: Normal heart sounds.  Pulmonary:     Effort: Pulmonary effort is normal.     Breath sounds: Normal breath sounds.  Abdominal:     General: Bowel sounds are normal.     Palpations: Abdomen is soft.  Musculoskeletal:        General: Normal range of motion.     Cervical back: Normal range of motion and neck supple.  Skin:    General: Skin is warm and dry.  Neurological:     General: No focal deficit present.     Mental Status: He is alert and oriented to person, place, and time.  Psychiatric:        Mood and Affect: Mood normal.        Behavior: Behavior normal.        Thought Content: Thought content normal.        Judgment: Judgment normal.     BP 127/64 (BP Location: Left Arm, Patient Position: Sitting, Cuff Size: Normal)   Pulse 64   Temp 98.1 F (36.7 C)   Resp 16   Ht 5\' 11"  (1.803 m)   Wt 209 lb 12.8 oz (95.2 kg)   SpO2 98%   BMI 29.26 kg/m  Wt Readings from Last 3 Encounters:  04/02/20 209 lb 12.8 oz (95.2 kg)   03/01/20 205 lb 6.4 oz (93.2 kg)  12/06/16 195 lb (88.5 kg)     Health Maintenance Due  Topic Date Due  . Hepatitis C Screening  Never done  . COVID-19 Vaccine (1) Never done  . COLONOSCOPY  Never done    There are no preventive care reminders to display for this patient.  Lab Results  Component Value Date   TSH 2.200 03/01/2020  Lab Results  Component Value Date   WBC 3.1 (L) 03/01/2020   HGB 15.9 03/01/2020   HCT 45.4 03/01/2020   MCV 89 03/01/2020   PLT 219 03/01/2020   Lab Results  Component Value Date   NA 141 03/01/2020   K 4.1 03/01/2020   CO2 23 03/01/2020   GLUCOSE 96 03/01/2020   BUN 16 03/01/2020   CREATININE 0.91 03/01/2020   BILITOT 0.4 03/01/2020   ALKPHOS 55 03/01/2020   AST 16 03/01/2020   ALT 12 03/01/2020   PROT 6.8 03/01/2020   ALBUMIN 4.5 03/01/2020   CALCIUM 9.5 03/01/2020   Lab Results  Component Value Date   CHOL 202 (H) 03/01/2020   Lab Results  Component Value Date   HDL 51 03/01/2020   Lab Results  Component Value Date   LDLCALC 127 (H) 03/01/2020   Lab Results  Component Value Date   TRIG 137 03/01/2020   Lab Results  Component Value Date   CHOLHDL 4.0 03/01/2020   Lab Results  Component Value Date   HGBA1C 5.6 03/01/2020   HGBA1C 5.6 03/01/2020   HGBA1C 5.6 (A) 03/01/2020   HGBA1C 5.6 03/01/2020    Assessment & Plan:   1. Urinary urgency We will initiate Oxybutynin today.  - oxybutynin (DITROPAN XL) 5 MG 24 hr tablet; Take 1 tablet (5 mg total) by mouth at bedtime.  Dispense: 90 tablet; Refill: 3  2. Dyslipidemia - atorvastatin (LIPITOR) 20 MG tablet; Take 1 tablet (20 mg total) by mouth daily.  Dispense: 90 tablet; Refill: 3  3. Gastroesophageal reflux disease without esophagitis - omeprazole (PRILOSEC) 20 MG capsule; Take 1 capsule (20 mg total) by mouth daily.  Dispense: 90 capsule; Refill: 3  4. Seasonal allergies - cetirizine (ZYRTEC) 10 MG tablet; Take 1 tablet (10 mg total) by mouth daily.   Dispense: 90 tablet; Refill: 3  5. Other polyneuropathy - gabapentin (NEURONTIN) 300 MG capsule; Take 1 capsule (300 mg total) by mouth at bedtime.  Dispense: 90 capsule; Refill: 3  6. Follow up He will follow up in 6 months.   Meds ordered this encounter  Medications  . cetirizine (ZYRTEC) 10 MG tablet    Sig: Take 1 tablet (10 mg total) by mouth daily.    Dispense:  90 tablet    Refill:  3  . atorvastatin (LIPITOR) 20 MG tablet    Sig: Take 1 tablet (20 mg total) by mouth daily.    Dispense:  90 tablet    Refill:  3  . gabapentin (NEURONTIN) 300 MG capsule    Sig: Take 1 capsule (300 mg total) by mouth at bedtime.    Dispense:  90 capsule    Refill:  3  . omeprazole (PRILOSEC) 20 MG capsule    Sig: Take 1 capsule (20 mg total) by mouth daily.    Dispense:  90 capsule    Refill:  3  . oxybutynin (DITROPAN XL) 5 MG 24 hr tablet    Sig: Take 1 tablet (5 mg total) by mouth at bedtime.    Dispense:  90 tablet    Refill:  3    No orders of the defined types were placed in this encounter.   No orders of the defined types were placed in this encounter.  Referral Orders  No referral(s) requested today    Raliegh Ip,  MSN, FNP-BC [facility]/Internal Medicine/Sickle Cell Center [facility] [address] [place], [place] [address] [phone]  [phone] fax   Problem List Items Addressed This Visit      Digestive   Gastroesophageal reflux disease without esophagitis   Relevant Medications   omeprazole (PRILOSEC) 20 MG capsule     Other   Dyslipidemia   Relevant Medications   atorvastatin (LIPITOR) 20 MG tablet   Urinary urgency - Primary   Relevant Medications   oxybutynin (DITROPAN XL) 5 MG 24 hr tablet    Other Visit Diagnoses    Pain with urination       Seasonal allergies       Relevant Medications   cetirizine (ZYRTEC) 10 MG tablet   Other polyneuropathy       Relevant Medications   gabapentin (NEURONTIN)  300 MG capsule   Follow up          Meds ordered this encounter  Medications  . cetirizine (ZYRTEC) 10 MG tablet    Sig: Take 1 tablet (10 mg total) by mouth daily.    Dispense:  90 tablet    Refill:  3  . atorvastatin (LIPITOR) 20 MG tablet    Sig: Take 1 tablet (20 mg total) by mouth daily.    Dispense:  90 tablet    Refill:  3  . gabapentin (NEURONTIN) 300 MG capsule    Sig: Take 1 capsule (300 mg total) by mouth at bedtime.    Dispense:  90 capsule    Refill:  3  . omeprazole (PRILOSEC) 20 MG capsule    Sig: Take 1 capsule (20 mg total) by mouth daily.    Dispense:  90 capsule    Refill:  3  . oxybutynin (DITROPAN XL) 5 MG 24 hr tablet    Sig: Take 1 tablet (5 mg total) by mouth at bedtime.    Dispense:  90 tablet    Refill:  3    Follow-up: Return in about 6 months (around 10/03/2020).    Kallie LocksNatalie M Honest Safranek, FNP

## 2020-04-02 NOTE — Patient Instructions (Addendum)
Allergies, Adult An allergy is when your body's defense system (immune system) overreacts to an otherwise harmless substance (allergen) that you breathe in or eat or something that touches your skin. When you come into contact with something that you are allergic to, your immune system produces certain proteins (antibodies). These proteins cause cells to release chemicals (histamines) that trigger the symptoms of an allergic reaction. Allergies often affect the nasal passages (allergic rhinitis), eyes (allergic conjunctivitis), skin (atopic dermatitis), and stomach. Allergies can be mild or severe. Allergies cannot spread from person to person (are not contagious). They can develop at any age and may be outgrown. What increases the risk? You may be at greater risk of allergies if other people in your family have allergies. What are the signs or symptoms? Symptoms depend on what type of allergy you have. They may include:  Runny, stuffy nose.  Sneezing.  Itchy mouth, ears, or throat.  Postnasal drip.  Sore throat.  Itchy, red, watery, or puffy eyes.  Skin rash or hives.  Stomach pain.  Vomiting.  Diarrhea.  Bloating.  Wheezing or coughing. People with a severe allergy to food, medicine, or an insect bite may have a life-threatening allergic reaction (anaphylaxis). Symptoms of anaphylaxis include:  Hives.  Itching.  Flushed face.  Swollen lips, tongue, or mouth.  Tight or swollen throat.  Chest pain or tightness in the chest.  Trouble breathing or shortness of breath.  Rapid heartbeat.  Dizziness or fainting.  Vomiting.  Diarrhea.  Pain in the abdomen. How is this diagnosed? This condition is diagnosed based on:  Your symptoms.  Your family and medical history.  A physical exam. You may need to see a health care provider who specializes in treating allergies (allergist). You may also have tests, including:  Skin tests to see which allergens are causing  your symptoms, such as: ? Skin prick test. In this test, your skin is pricked with a tiny needle and exposed to small amounts of possible allergens to see if your skin reacts. ? Intradermal skin test. In this test, a small amount of allergen is injected under your skin to see if your skin reacts. ? Patch test. In this test, a small amount of allergen is placed on your skin and then your skin is covered with a bandage. Your health care provider will check your skin after a couple of days to see if a rash has developed.  Blood tests.  Challenges tests. In this test, you inhale a small amount of allergen by mouth to see if you have an allergic reaction. You may also be asked to:  Keep a food diary. A food diary is a record of all the foods and drinks you have in a day and any symptoms you experience.  Practice an elimination diet. An elimination diet involves eliminating specific foods from your diet and then adding them back in one by one to find out if a certain food causes an allergic reaction. How is this treated? Treatment for allergies depends on your symptoms. Treatment may include:  Cold compresses to soothe itching and swelling.  Eye drops.  Nasal sprays.  Using a saline spray or container (neti pot) to flush out the nose (nasal irrigation). These methods can help clear away mucus and keep the nasal passages moist.  Using a humidifier.  Oral antihistamines or other medicines to block allergic reaction and inflammation.  Skin creams to treat rashes or itching.  Diet changes to eliminate food allergy triggers.    Repeated exposure to tiny amounts of allergens to build up a tolerance and prevent future allergic reactions (immunotherapy). These include: ? Allergy shots. ? Oral treatment. This involves taking small doses of an allergen under the tongue (sublingual immunotherapy).  Emergency epinephrine injection (auto-injector) in case of an allergic emergency. This is a  self-injectable, pre-measured medicine that must be given within the first few minutes of a serious allergic reaction. Follow these instructions at home:         Avoid known allergens whenever possible.  If you suffer from airborne allergens, wash out your nose daily. You can do this with a saline spray or a neti pot to flush out your nose (nasal irrigation).  Take over-the-counter and prescription medicines only as told by your health care provider.  Keep all follow-up visits as told by your health care provider. This is important.  If you are at risk of a severe allergic reaction (anaphylaxis), keep your auto-injector with you at all times.  If you have ever had anaphylaxis, wear a medical alert bracelet or necklace that states you have a severe allergy. Contact a health care provider if:  Your symptoms do not improve with treatment. Get help right away if:  You have symptoms of anaphylaxis, such as: ? Swollen mouth, tongue, or throat. ? Pain or tightness in your chest. ? Trouble breathing or shortness of breath. ? Dizziness or fainting. ? Severe abdominal pain, vomiting, or diarrhea. This information is not intended to replace advice given to you by your health care provider. Make sure you discuss any questions you have with your health care provider. Document Revised: 10/31/2017 Document Reviewed: 02/23/2016 Elsevier Patient Education  [place]. Cetirizine tablets What is this medicine? CETIRIZINE (se TI ra zeen) is an antihistamine. This medicine is used to treat or prevent symptoms of allergies. It is also used to help reduce itchy skin rash and hives. This medicine may be used for other purposes; ask your health care provider or pharmacist if you have questions. COMMON BRAND NAME(S): All Day Allergy, Allergy Relief, Zyrtec, Zyrtec Hives Relief What should I tell my health care provider before I take this medicine? They need to know if you have any of these  conditions:  kidney disease  liver disease  an unusual or allergic reaction to cetirizine, hydroxyzine, other medicines, foods, dyes, or preservatives  pregnant or trying to get pregnant  breast-feeding How should I use this medicine? Take this medicine by mouth with a glass of water. Follow the directions on the prescription label. You can take this medicine with food or on an empty stomach. Take your medicine at regular times. Do not take more often than directed. You may need to take this medicine for several days before your symptoms improve. Talk to your pediatrician regarding the use of this medicine in children. Special care may be needed. While this drug may be prescribed for children as young as 2 years of age for selected conditions, precautions do apply. Overdosage: If you think you have taken too much of this medicine contact a poison control center or emergency room at once. NOTE: This medicine is only for you. Do not share this medicine with others. What if I miss a dose? If you miss a dose, take it as soon as you can. If it is almost time for your next dose, take only that dose. Do not take double or extra doses. What may interact with this medicine?  alcohol  certain medicines for anxiety  or sleep  narcotic medicines for pain  other medicines for colds or allergies This list may not describe all possible interactions. Give your health care provider a list of all the medicines, herbs, non-prescription drugs, or dietary supplements you use. Also tell them if you smoke, drink alcohol, or use illegal drugs. Some items may interact with your medicine. What should I watch for while using this medicine? Visit your doctor or health care professional for regular checks on your health. Tell your doctor if your symptoms do not improve. You may get drowsy or dizzy. Do not drive, use machinery, or do anything that needs mental alertness until you know how this medicine affects you.  Do not stand or sit up quickly, especially if you are an older patient. This reduces the risk of dizzy or fainting spells. Your mouth may get dry. Chewing sugarless gum or sucking hard candy, and drinking plenty of water may help. Contact your doctor if the problem does not go away or is severe. What side effects may I notice from receiving this medicine? Side effects that you should report to your doctor or health care professional as soon as possible:  allergic reactions like skin rash, itching or hives, swelling of the face, lips, or tongue  changes in vision or hearing  fast or irregular heartbeat  trouble passing urine or change in the amount of urine Side effects that usually do not require medical attention (report to your doctor or health care professional if they continue or are bothersome):  dizziness  dry mouth  irritability  sore throat  stomach pain  tiredness This list may not describe all possible side effects. Call your doctor for medical advice about side effects. You may report side effects to FDA at 1-800-FDA-1088. Where should I keep my medicine? Keep out of the reach of children. Store at room temperature between 15 and 30 degrees C (59 and 86 degrees F). Throw away any unused medicine after the expiration date. NOTE: This sheet is a summary. It may not cover all possible information. If you have questions about this medicine, talk to your doctor, pharmacist, or health care provider.  2020 Elsevier/Gold Standard (2014-09-01 13:44:42) Oxybutynin tablets What is this medicine? OXYBUTYNIN (ox i BYOO ti nin) is used to treat overactive bladder. This medicine reduces the amount of bathroom visits. It may also help to control wetting accidents. This medicine may be used for other purposes; ask your health care provider or pharmacist if you have questions. COMMON BRAND NAME(S): Ditropan What should I tell my health care provider before I take this medicine? They  need to know if you have any of these conditions:  autonomic neuropathy  dementia  difficulty passing urine  glaucoma  intestinal obstruction  kidney disease  liver disease  myasthenia gravis  Parkinson's disease  an unusual or allergic reaction to oxybutynin, other medicines, foods, dyes, or preservatives  pregnant or trying to get pregnant  breast-feeding How should I use this medicine? Take this medicine by mouth with a glass of water. Follow the directions on the prescription label. You can take this medicine with or without food. Take your medicine at regular intervals. Do not take your medicine more often than directed. Talk to your pediatrician regarding the use of this medicine in children. Special care may be needed. While this drug may be prescribed for children as young as 5 years for selected conditions, precautions do apply. Overdosage: If you think you have taken too much of this  medicine contact a poison control center or emergency room at once. NOTE: This medicine is only for you. Do not share this medicine with others. What if I miss a dose? If you miss a dose, take it as soon as you can. If it is almost time for your next dose, take only that dose. Do not take double or extra doses. What may interact with this medicine?  antihistamines for allergy, cough and cold  atropine  certain medicines for bladder problems like oxybutynin, tolterodine  certain medicines for Parkinson's disease like benztropine, trihexyphenidyl  certain medicines for stomach problems like dicyclomine, hyoscyamine  certain medicines for travel sickness like scopolamine  clarithromycin  erythromycin  ipratropium  medicines for fungal infections, like fluconazole, itraconazole, ketoconazole or voriconazole This list may not describe all possible interactions. Give your health care provider a list of all the medicines, herbs, non-prescription drugs, or dietary supplements you  use. Also tell them if you smoke, drink alcohol, or use illegal drugs. Some items may interact with your medicine. What should I watch for while using this medicine? It may take a few weeks to notice the full benefit from this medicine. You may need to limit your intake tea, coffee, caffeinated sodas, and alcohol. These drinks may make your symptoms worse. You may get drowsy or dizzy. Do not drive, use machinery, or do anything that needs mental alertness until you know how this medicine affects you. Do not stand or sit up quickly, especially if you are an older patient. This reduces the risk of dizzy or fainting spells. Alcohol may interfere with the effect of this medicine. Avoid alcoholic drinks. Your mouth may get dry. Chewing sugarless gum or sucking hard candy, and drinking plenty of water may help. Contact your doctor if the problem does not go away or is severe. This medicine may cause dry eyes and blurred vision. If you wear contact lenses, you may feel some discomfort. Lubricating drops may help. See your eyecare professional if the problem does not go away or is severe. Avoid extreme heat. This medicine can cause you to sweat less than normal. Your body temperature could increase to dangerous levels, which may lead to heat stroke. What side effects may I notice from receiving this medicine? Side effects that you should report to your doctor or health care professional as soon as possible:  allergic reactions like skin rash, itching or hives, swelling of the face, lips, or tongue  agitation  breathing problems  confusion  fever  flushing (reddening of the skin)  hallucinations  memory loss  pain or difficulty passing urine  palpitations  unusually weak or tired Side effects that usually do not require medical attention (report to your doctor or health care professional if they continue or are bothersome):  constipation  headache  sexual difficulties (impotence) This  list may not describe all possible side effects. Call your doctor for medical advice about side effects. You may report side effects to FDA at 1-800-FDA-1088. Where should I keep my medicine? Keep out of the reach of children. Store at room temperature between 15 and 30 degrees C (59 and 86 degrees F). Protect from moisture and humidity. Throw away any unused medicine after the expiration date. NOTE: This sheet is a summary. It may not cover all possible information. If you have questions about this medicine, talk to your doctor, pharmacist, or health care provider.  2020 Elsevier/Gold Standard (2013-10-23 10:57:08)  Urinary Frequency, Adult Urinary frequency means urinating more often than  usual. You may urinate every 1-2 hours even though you drink a normal amount of fluid and do not have a bladder infection or condition. Although you urinate more often than normal, the total amount of urine produced in a day is normal. With urinary frequency, you may have an urgent need to urinate often. The stress and anxiety of needing to find a bathroom quickly can make this urge worse. This condition may go away on its own or you may need treatment at home. Home treatment may include bladder training, exercises, taking medicines, or making changes to your diet. Follow these instructions at home: Bladder health   Keep a bladder diary if told by your health care provider. Keep track of: ? What you eat and drink. ? How often you urinate. ? How much you urinate.  Follow a bladder training program if told by your health care provider. This may include: ? Learning to delay going to the bathroom. ? Double urinating (voiding). This helps if you are not completely emptying your bladder. ? Scheduled voiding.  Do Kegel exercises as told by your health care provider. Kegel exercises strengthen the muscles that help control urination, which may help the condition. Eating and drinking  If told by your health  care provider, make diet changes, such as: ? Avoiding caffeine. ? Drinking fewer fluids, especially alcohol. ? Not drinking in the evening. ? Avoiding foods or drinks that may irritate the bladder. These include coffee, tea, soda, artificial sweeteners, citrus, tomato-based foods, and chocolate. ? Eating foods that help prevent or ease constipation. Constipation can make this condition worse. Your health care provider may recommend that you:  Drink enough fluid to keep your urine pale yellow.  Take over-the-counter or prescription medicines.  Eat foods that are high in fiber, such as beans, whole grains, and fresh fruits and vegetables.  Limit foods that are high in fat and processed sugars, such as fried or sweet foods. General instructions  Take over-the-counter and prescription medicines only as told by your health care provider.  Keep all follow-up visits as told by your health care provider. This is important. Contact a health care provider if:  You start urinating more often.  You feel pain or irritation when you urinate.  You notice blood in your urine.  Your urine looks cloudy.  You develop a fever.  You begin vomiting. Get help right away if:  You are unable to urinate. Summary  Urinary frequency means urinating more often than usual. With urinary frequency, you may urinate every 1-2 hours even though you drink a normal amount of fluid and do not have a bladder infection or other bladder condition.  Your health care provider may recommend that you keep a bladder diary, follow a bladder training program, or make dietary changes.  If told by your health care provider, do Kegel exercises to strengthen the muscles that help control urination.  Take over-the-counter and prescription medicines only as told by your health care provider.  Contact a health care provider if your symptoms do not improve or get worse. This information is not intended to replace advice given  to you by your health care provider. Make sure you discuss any questions you have with your health care provider. Document Revised: 02/14/2018 Document Reviewed: 02/14/2018 Elsevier Patient Education  2020 [employer].

## 2020-10-05 ENCOUNTER — Ambulatory Visit: Payer: No Typology Code available for payment source | Admitting: Family Medicine

## 2020-10-12 ENCOUNTER — Encounter: Payer: Self-pay | Admitting: Family Medicine

## 2020-10-12 ENCOUNTER — Ambulatory Visit (INDEPENDENT_AMBULATORY_CARE_PROVIDER_SITE_OTHER): Payer: 59 | Admitting: Family Medicine

## 2020-10-12 ENCOUNTER — Other Ambulatory Visit: Payer: Self-pay

## 2020-10-12 ENCOUNTER — Ambulatory Visit (HOSPITAL_COMMUNITY)
Admission: RE | Admit: 2020-10-12 | Discharge: 2020-10-12 | Disposition: A | Discharge status: Home / Self Care | Payer: 59 | Source: Ambulatory Visit | Attending: Family Medicine | Admitting: Family Medicine

## 2020-10-12 VITALS — BP 140/79 | HR 54 | Ht 71.0 in | Wt 206.0 lb

## 2020-10-12 DIAGNOSIS — R1010 Upper abdominal pain, unspecified: Secondary | ICD-10-CM | POA: Diagnosis not present

## 2020-10-12 DIAGNOSIS — K219 Gastro-esophageal reflux disease without esophagitis: Secondary | ICD-10-CM

## 2020-10-12 DIAGNOSIS — R1904 Left lower quadrant abdominal swelling, mass and lump: Secondary | ICD-10-CM

## 2020-10-12 DIAGNOSIS — M25552 Pain in left hip: Secondary | ICD-10-CM | POA: Diagnosis not present

## 2020-10-12 DIAGNOSIS — Z09 Encounter for follow-up examination after completed treatment for conditions other than malignant neoplasm: Secondary | ICD-10-CM

## 2020-10-12 DIAGNOSIS — E559 Vitamin D deficiency, unspecified: Secondary | ICD-10-CM

## 2020-10-12 DIAGNOSIS — R1011 Right upper quadrant pain: Secondary | ICD-10-CM | POA: Diagnosis not present

## 2020-10-12 NOTE — Progress Notes (Signed)
[facility]   Sick Visit  Subjective:  Patient ID: Jeffery Reyes, male    DOB: 01-31-1961  Age: 60 y.o. MRN: [id number]  CC:  Chief Complaint  Patient presents with  . Follow-up  . Hip Pain    Left side  . Abdominal Pain    Right side    HPI Jeffery Reyes is a 60 year old male who presents for Sick Visit today.    Patient Active Problem List   Diagnosis Date Noted  . Urinary urgency 03/01/2020  . Hyperglycemia 03/01/2020  . Vision problems 03/01/2020  . Gastroesophageal reflux disease without esophagitis 03/01/2020  . Carpal tunnel syndrome, right 06/06/2019  . Upper abdominal pain 11/11/2016  . Left hip pain 11/11/2016  . Arthralgia 11/11/2016  . Dyslipidemia 09/23/2014  . Vitamin D deficiency 09/04/2014  . Neutropenia (HCC) 09/04/2014   Current Status: Since his last office visit, he is doing well with no complaints. He has c/o increasing chronic left hip pain today. He has been not been taking any medication for aid in pain relief. He has also been having increasing right upper abdominal pain lately. He has c/o acid reflux. He declines taking Omeprazole for acid reflux. No other reports of GI problems such as nausea, vomiting, diarrhea, and constipation. He has no reports of blood in stools, dysuria and hematuria. He denies fevers, chills, fatigue, recent infections, weight loss, and night sweats. He has not had any headaches, visual changes, dizziness, and falls. No chest pain, heart palpitations, cough and shortness of breath reported.  He is taking other all medications as prescribed.   Past Medical History:  Diagnosis Date  . Elevated cholesterol 02/2020  . GERD (gastroesophageal reflux disease) 02/2020  . High cholesterol   . Kidney stones   . Peripheral neuropathy 02/2020  . Proteinuria   . Urinary urgency 02/2020  . Urinary urgency   . Visual problems 02/2020  . Vitamin D deficiency 02/2020    Past Surgical  History:  Procedure Laterality Date  . nerve release hand Left 2015  . REPAIR / RECONSTRUCTION COLLATERAL LIGAMENT HAND Left     Family History  Problem Relation Age of Onset  . Hypertension Father   . Stroke Brother     Social History   Socioeconomic History  . Marital status: Married    Spouse name: Not on file  . Number of children: Not on file  . Years of education: Not on file  . Highest education level: Not on file  Occupational History  . Not on file  Tobacco Use  . Smoking status: Former Games developer  . Smokeless tobacco: Never Used  Substance and Sexual Activity  . Alcohol use: No    Alcohol/week: 0.0 standard drinks  . Drug use: No  . Sexual activity: Not on file  Other Topics Concern  . Not on file  Social History Narrative  . Not on file   Social Determinants of Health   Financial Resource Strain: Not on file  Food Insecurity: Not on file  Transportation Needs: Not on file  Physical Activity: Not on file  Stress: Not on file  Social Connections: Not on file  Intimate Partner Violence: Not on file    Outpatient Medications Prior to Visit  Medication Sig Dispense Refill  . atorvastatin (LIPITOR) 20 MG tablet Take 1 tablet (20 mg total) by mouth daily. 90 tablet 3  . cetirizine (ZYRTEC) 10 MG tablet Take 1 tablet (10 mg  total) by mouth daily. 90 tablet 3  . Cholecalciferol (VITAMIN D) 2000 units CAPS Take by mouth.     . gabapentin (NEURONTIN) 300 MG capsule Take 1 capsule (300 mg total) by mouth at bedtime. 90 capsule 3  . omeprazole (PRILOSEC) 20 MG capsule Take 1 capsule (20 mg total) by mouth daily. 90 capsule 3  . oxybutynin (DITROPAN XL) 5 MG 24 hr tablet Take 1 tablet (5 mg total) by mouth at bedtime. 90 tablet 3  . aspirin EC 81 MG tablet Take 1 tablet (81 mg total) by mouth daily. (Patient not taking: Reported on 03/01/2020) 30 tablet 11   No facility-administered medications prior to visit.    No Known Allergies  ROS Review of Systems   Constitutional: Negative.   HENT: Negative.   Eyes: Negative.   Respiratory: Negative.   Cardiovascular: Negative.   Gastrointestinal: Positive for abdominal pain.  Endocrine: Negative.   Genitourinary: Negative.   Musculoskeletal: Positive for arthralgias (hip pain).  Skin: Negative.   Allergic/Immunologic: Negative.   Neurological: Negative.   Hematological: Negative.   Psychiatric/Behavioral: Negative.       Objective:    Physical Exam Vitals and nursing note reviewed.  Constitutional:      Appearance: He is well-developed.  Cardiovascular:     Rate and Rhythm: Normal rate and regular rhythm.  Abdominal:     General: Bowel sounds are normal.  Neurological:     Mental Status: He is alert.    BP 140/79   Pulse (!) 54   Ht 5\' 11"  (1.803 m)   Wt 206 lb (93.4 kg)   SpO2 98%   BMI 28.73 kg/m  Wt Readings from Last 3 Encounters:  10/12/20 206 lb (93.4 kg)  04/02/20 209 lb 12.8 oz (95.2 kg)  03/01/20 205 lb 6.4 oz (93.2 kg)     Health Maintenance Due  Topic Date Due  . Hepatitis C Screening  Never done  . COVID-19 Vaccine (1) Never done  . COLONOSCOPY (Pts 45-32yrs Insurance coverage will need to be confirmed)  Never done    There are no preventive care reminders to display for this patient.  Lab Results  Component Value Date   TSH 2.200 03/01/2020   Lab Results  Component Value Date   WBC 3.1 (L) 03/01/2020   HGB 15.9 03/01/2020   HCT 45.4 03/01/2020   MCV 89 03/01/2020   PLT 219 03/01/2020   Lab Results  Component Value Date   NA 141 03/01/2020   K 4.1 03/01/2020   CO2 23 03/01/2020   GLUCOSE 96 03/01/2020   BUN 16 03/01/2020   CREATININE 0.91 03/01/2020   BILITOT 0.4 03/01/2020   ALKPHOS 55 03/01/2020   AST 16 03/01/2020   ALT 12 03/01/2020   PROT 6.8 03/01/2020   ALBUMIN 4.5 03/01/2020   CALCIUM 9.5 03/01/2020   Lab Results  Component Value Date   CHOL 202 (H) 03/01/2020   Lab Results  Component Value Date   HDL 51 03/01/2020    Lab Results  Component Value Date   LDLCALC 127 (H) 03/01/2020   Lab Results  Component Value Date   TRIG 137 03/01/2020   Lab Results  Component Value Date   CHOLHDL 4.0 03/01/2020   Lab Results  Component Value Date   HGBA1C 5.6 03/01/2020   HGBA1C 5.6 03/01/2020   HGBA1C 5.6 (A) 03/01/2020   HGBA1C 5.6 03/01/2020      Assessment & Plan:   1. Left hip pain -  DG Hip Unilat W OR W/O Pelvis 2-3 Views Left; Future  2. Upper abdominal pain - US Abdomen Complete; Future  3. Left lower quadrant abdominal mass Mass palpated during exam today.  - US Abdomen Complete; Future  4. Right upper quadrant abdominal pain - US Abdomen Complete; Future  5. Gastroesophageal reflux disease without esophagitis He wil  Begin taking Omeprazole daily.   6. Vitamin D deficiency - Vitamin D, 25-hydroxy  7. Follow up He will follow up in 02/1021 for Annual Physical and Labwork.   No orders of the defined types were placed in this encounter.   Orders Placed This Encounter  Procedures  . US Abdomen Complete  . DG Hip Unilat W OR W/O Pelvis 2-3 Views Left  . Vitamin D, 25-hydroxy    Referral Orders  No referral(s) requested today    Raliegh Ip, MSN, ANE, FNP-BC [facility] [address] [place], [place] [address] [phone] fax  Problem List Items Addressed This Visit      Digestive   Gastroesophageal reflux disease without esophagitis     Other   Left hip pain - Primary   Relevant Orders   DG Hip Unilat W OR W/O Pelvis 2-3 Views Left   Upper abdominal pain   Relevant Orders   US Abdomen Complete   Vitamin D deficiency   Relevant Orders   Vitamin D, 25-hydroxy    Other Visit Diagnoses    Left lower quadrant abdominal mass       Relevant Orders   US Abdomen Complete   Right upper quadrant abdominal pain       Relevant Orders   US Abdomen Complete    Follow up          No orders of the defined types were placed in this encounter.   Follow-up: No follow-ups on file.    Kallie Locks, FNP

## 2020-10-13 LAB — VITAMIN D 25 HYDROXY (VIT D DEFICIENCY, FRACTURES): Vit D, 25-Hydroxy: 28 ng/mL — ABNORMAL LOW (ref 30.0–100.0)

## 2020-10-21 ENCOUNTER — Ambulatory Visit (HOSPITAL_COMMUNITY): Admission: RE | Admit: 2020-10-21 | Payer: 59 | Source: Ambulatory Visit

## 2020-11-05 ENCOUNTER — Telehealth: Payer: Self-pay

## 2020-11-05 NOTE — Telephone Encounter (Signed)
X ray / lab results diagnosis  Pt wants a call to discuss.

## 2020-11-08 ENCOUNTER — Other Ambulatory Visit: Payer: Self-pay | Admitting: Family Medicine

## 2020-11-08 DIAGNOSIS — M25552 Pain in left hip: Secondary | ICD-10-CM

## 2020-11-12 ENCOUNTER — Ambulatory Visit: Payer: 59 | Admitting: Family Medicine

## 2020-11-22 ENCOUNTER — Ambulatory Visit (INDEPENDENT_AMBULATORY_CARE_PROVIDER_SITE_OTHER): Payer: 59 | Admitting: Family Medicine

## 2020-11-22 ENCOUNTER — Encounter: Payer: Self-pay | Admitting: Family Medicine

## 2020-11-22 ENCOUNTER — Ambulatory Visit (HOSPITAL_COMMUNITY)
Admission: RE | Admit: 2020-11-22 | Discharge: 2020-11-22 | Disposition: A | Discharge status: Home / Self Care | Payer: 59 | Source: Ambulatory Visit | Attending: Family Medicine | Admitting: Family Medicine

## 2020-11-22 ENCOUNTER — Other Ambulatory Visit: Payer: Self-pay

## 2020-11-22 VITALS — BP 142/77 | HR 50 | Ht 71.0 in | Wt 207.0 lb

## 2020-11-22 DIAGNOSIS — R1904 Left lower quadrant abdominal swelling, mass and lump: Secondary | ICD-10-CM

## 2020-11-22 DIAGNOSIS — G6289 Other specified polyneuropathies: Secondary | ICD-10-CM

## 2020-11-22 DIAGNOSIS — G8929 Other chronic pain: Secondary | ICD-10-CM | POA: Diagnosis present

## 2020-11-22 DIAGNOSIS — Z09 Encounter for follow-up examination after completed treatment for conditions other than malignant neoplasm: Secondary | ICD-10-CM

## 2020-11-22 DIAGNOSIS — M25562 Pain in left knee: Secondary | ICD-10-CM | POA: Diagnosis not present

## 2020-11-22 MED ORDER — GABAPENTIN 300 MG PO CAPS: 300.0000 mg | ORAL_CAPSULE | Freq: Every day | ORAL | 3 refills | Status: AC

## 2020-11-22 NOTE — Patient Instructions (Signed)
RICE Therapy for Routine Care of Injuries Many injuries can be cared for with rest, ice, compression, and elevation (RICE therapy). This includes:  Resting the injured body part.  Putting ice on the injury.  Putting pressure (compression) on the injury.  Raising the injured part (elevation). Using RICE therapy can help to lessen pain and swelling. Supplies needed:  Ice.  Plastic bag.  Towel.  Elastic bandage.  Pillow or pillows to raise your injured body part. How to care for your injury with RICE therapy Rest Try to rest the injured part of your body. You can go back to your normal activities when your doctor says it is okay to do them and when you can do them without pain. If you rest the injury too much, it may not heal as well. Some injuries heal better with early movement instead of resting for too long. Ask your doctor if you should do exercises to help your injury get better. Ice  If told, put ice on the injured area. To do this: ? Put ice in a plastic bag. ? Place a towel between your skin and the bag. ? Leave the ice on for 20 minutes, 2-3 times a day. ? Take off the ice if your skin turns bright red. This is very important. If you cannot feel pain, heat, or cold, you have a greater risk of damage to the area.  Do not put ice on your bare skin. Use ice for as many days as your doctor tells you to use it.   Compression Put pressure on the injured area. This can be done with an elastic bandage. If this type of bandage has been put on your injury:  Follow instructions on the package the bandage came in about how to use it.  Do not wrap the bandage too tightly. ? Wrap the bandage more loosely if part of your body beyond the bandage is blue, swollen, cold, painful, or loses feeling.  Take off the bandage and put it on again every 3-4 hours or as told by your doctor.  See your doctor if the bandage seems to make your problems worse.   Elevation Raise the injured area  above the level of your heart while you are sitting or lying down. Follow these instructions at home:  If your symptoms get worse or last a long time, make a follow-up appointment with your doctor. You may need to have imaging tests, such as X-rays or an MRI.  If you have imaging tests, ask how to get your results when they are ready.  Return to your normal activities when your doctor says that it is safe.  Keep all follow-up visits. Contact a doctor if:  You keep having pain and swelling.  Your symptoms get worse. Get help right away if:  You have sudden, very bad pain at your injury or lower than your injury.  You have redness or more swelling around your injury.  You have tingling or numbness at your injury or lower than your injury, and it does not go away when you take off the bandage. Summary  Many injuries can be cared for using rest, ice, compression, and elevation (RICE therapy).  You can go back to your normal activities when your doctor says it is okay and when you can do them without pain.  Put ice on the injured area as told by your doctor.  Get help if your symptoms get worse or if you keep having pain and swelling.   This information is not intended to replace advice given to you by your health care provider. Make sure you discuss any questions you have with your health care provider. Document Revised: 05/27/2020 Document Reviewed: 05/27/2020 Elsevier Patient Education  2021 Elsevier Inc.  

## 2020-11-22 NOTE — Progress Notes (Signed)
[facility]  Sick Visit  Subjective:  Patient ID: Jeffery Reyes, male    DOB: Apr 17, 1961  Age: 60 y.o. MRN: [id number]  CC:  Chief Complaint  Patient presents with  . Annual Exam  . Gait Problem    Left hip pain    HPI Jeffery Reyes is a 59 year old male who presents for Sick Visit  Patient Active Problem List   Diagnosis Date Noted  . Urinary urgency 03/01/2020  . Hyperglycemia 03/01/2020  . Vision problems 03/01/2020  . Gastroesophageal reflux disease without esophagitis 03/01/2020  . Carpal tunnel syndrome, right 06/06/2019  . Upper abdominal pain 11/11/2016  . Left hip pain 11/11/2016  . Arthralgia 11/11/2016  . Dyslipidemia 09/23/2014  . Vitamin D deficiency 09/04/2014  . Neutropenia (HCC) 09/04/2014   Current Status: Since his last office visit, he has c/o left hip pain which radiates to left leg. He is taking OTC pain medications as needed. He denies fevers, chills, fatigue, recent infections, weight loss, and night sweats. He has not had any headaches, visual changes, dizziness, and falls. No chest pain, heart palpitations, cough and shortness of breath reported. Denies GI problems such as nausea, vomiting, diarrhea, and constipation. He has no reports of blood in stools, dysuria and hematuria. No depression or anxiety reported today. He is taking all medications as prescribed.  Past Medical History:  Diagnosis Date  . Elevated cholesterol 02/2020  . GERD (gastroesophageal reflux disease) 02/2020  . High cholesterol   . Kidney stones   . Peripheral neuropathy 02/2020  . Proteinuria   . Urinary urgency 02/2020  . Urinary urgency   . Visual problems 02/2020  . Vitamin D deficiency 02/2020    Past Surgical History:  Procedure Laterality Date  . nerve release hand Left 2015  . REPAIR / RECONSTRUCTION COLLATERAL LIGAMENT HAND Left     Family History  Problem Relation Age of Onset  . Hypertension Father   . Stroke  Brother     Social History   Socioeconomic History  . Marital status: Married    Spouse name: Not on file  . Number of children: Not on file  . Years of education: Not on file  . Highest education level: Not on file  Occupational History  . Not on file  Tobacco Use  . Smoking status: Former Games developer  . Smokeless tobacco: Never Used  Substance and Sexual Activity  . Alcohol use: No    Alcohol/week: 0.0 standard drinks  . Drug use: No  . Sexual activity: Not on file  Other Topics Concern  . Not on file  Social History Narrative  . Not on file   Social Determinants of Health   Financial Resource Strain: Not on file  Food Insecurity: Not on file  Transportation Needs: Not on file  Physical Activity: Not on file  Stress: Not on file  Social Connections: Not on file  Intimate Partner Violence: Not on file    Outpatient Medications Prior to Visit  Medication Sig Dispense Refill  . atorvastatin (LIPITOR) 20 MG tablet Take 1 tablet (20 mg total) by mouth daily. 90 tablet 3  . cetirizine (ZYRTEC) 10 MG tablet Take 1 tablet (10 mg total) by mouth daily. 90 tablet 3  . Cholecalciferol (VITAMIN D) 2000 units CAPS Take by mouth.     Marland Kitchen omeprazole (PRILOSEC) 20 MG capsule Take 1 capsule (20 mg total) by mouth daily. 90 capsule 3  . oxybutynin (DITROPAN XL)  5 MG 24 hr tablet Take 1 tablet (5 mg total) by mouth at bedtime. 90 tablet 3  . gabapentin (NEURONTIN) 300 MG capsule Take 1 capsule (300 mg total) by mouth at bedtime. 90 capsule 3   No facility-administered medications prior to visit.    No Known Allergies  ROS Review of Systems  Constitutional: Negative.   HENT: Negative.   Eyes: Negative.   Respiratory: Negative.   Cardiovascular: Negative.   Gastrointestinal: Negative.   Endocrine: Negative.   Genitourinary: Negative.   Musculoskeletal: Positive for arthralgias (generalized joint pain) and gait problem (chronic left knee pain).  Skin: Negative.    Allergic/Immunologic: Negative.   Neurological: Positive for dizziness (occasional ) and headaches (occasional ).  Hematological: Negative.   Psychiatric/Behavioral: Negative.    Objective:    Physical Exam Vitals and nursing note reviewed.  Constitutional:      Appearance: Normal appearance.  HENT:     Head: Normocephalic and atraumatic.     Nose: Nose normal.     Mouth/Throat:     Mouth: Mucous membranes are moist.     Pharynx: Oropharynx is clear.  Cardiovascular:     Rate and Rhythm: Normal rate and regular rhythm.     Pulses: Normal pulses.     Heart sounds: Normal heart sounds.  Pulmonary:     Effort: Pulmonary effort is normal.     Breath sounds: Normal breath sounds.  Abdominal:     General: Bowel sounds are normal.     Palpations: Abdomen is soft.  Musculoskeletal:     Cervical back: Normal range of motion and neck supple.  Neurological:     Mental Status: He is alert.    BP (!) 142/77   Pulse (!) 50   Ht 5\' 11"  (1.803 m)   Wt 207 lb (93.9 kg)   SpO2 100%   BMI 28.87 kg/m  Wt Readings from Last 3 Encounters:  11/22/20 207 lb (93.9 kg)  10/12/20 206 lb (93.4 kg)  04/02/20 209 lb 12.8 oz (95.2 kg)     There are no preventive care reminders to display for this patient.  There are no preventive care reminders to display for this patient.  Lab Results  Component Value Date   TSH 2.200 03/01/2020   Lab Results  Component Value Date   WBC 3.1 (L) 03/01/2020   HGB 15.9 03/01/2020   HCT 45.4 03/01/2020   MCV 89 03/01/2020   PLT 219 03/01/2020   Lab Results  Component Value Date   NA 141 03/01/2020   K 4.1 03/01/2020   CO2 23 03/01/2020   GLUCOSE 96 03/01/2020   BUN 16 03/01/2020   CREATININE 0.91 03/01/2020   BILITOT 0.4 03/01/2020   ALKPHOS 55 03/01/2020   AST 16 03/01/2020   ALT 12 03/01/2020   PROT 6.8 03/01/2020   ALBUMIN 4.5 03/01/2020   CALCIUM 9.5 03/01/2020   Lab Results  Component Value Date   CHOL 202 (H) 03/01/2020   Lab  Results  Component Value Date   HDL 51 03/01/2020   Lab Results  Component Value Date   LDLCALC 127 (H) 03/01/2020   Lab Results  Component Value Date   TRIG 137 03/01/2020   Lab Results  Component Value Date   CHOLHDL 4.0 03/01/2020   Lab Results  Component Value Date   HGBA1C 5.6 03/01/2020   HGBA1C 5.6 03/01/2020   HGBA1C 5.6 (A) 03/01/2020   HGBA1C 5.6 03/01/2020      Assessment &  Plan:   1. Chronic pain of left knee - DG Knee Complete 4 Views Left; Future  2. Other polyneuropathy We will initiate Gabapentin today. Monitor.  - gabapentin (NEURONTIN) 300 MG capsule; Take 1 capsule (300 mg total) by mouth at bedtime.  Dispense: 90 capsule; Refill: 3 - Ambulatory referral to Physical Therapy  3. Left lower quadrant abdominal mass Patient missed previously scheduled [place] of Abdomen. He will contact radiology to reschedule. Monitor.   4. Follow up He will keep follow up appointment.   Meds ordered this encounter  Medications  . gabapentin (NEURONTIN) 300 MG capsule    Sig: Take 1 capsule (300 mg total) by mouth at bedtime.    Dispense:  90 capsule    Refill:  3    Orders Placed This Encounter  Procedures  . DG Knee Complete 4 Views Left  . Ambulatory referral to Physical Therapy     Referral Orders     Ambulatory referral to Physical Therapy   Raliegh Ip, MSN, ANE, FNP-BC [facility] [address] [place], [place] [address] [phone] fax  Problem List Items Addressed This Visit   None   Visit Diagnoses    Chronic pain of left knee    -  Primary   Relevant Medications   gabapentin (NEURONTIN) 300 MG capsule   Other Relevant Orders   DG Knee Complete 4 Views Left   Other polyneuropathy       Relevant Medications   gabapentin (NEURONTIN) 300 MG capsule   Other Relevant Orders   Ambulatory referral to Physical Therapy   Left lower  quadrant abdominal mass       Follow up          Meds ordered this encounter  Medications  . gabapentin (NEURONTIN) 300 MG capsule    Sig: Take 1 capsule (300 mg total) by mouth at bedtime.    Dispense:  90 capsule    Refill:  3    Follow-up: No follow-ups on file.    Kallie Locks, FNP

## 2020-11-29 ENCOUNTER — Other Ambulatory Visit: Payer: Self-pay

## 2020-11-29 ENCOUNTER — Ambulatory Visit (HOSPITAL_COMMUNITY)
Admission: RE | Admit: 2020-11-29 | Discharge: 2020-11-29 | Disposition: A | Discharge status: Home / Self Care | Payer: 59 | Source: Ambulatory Visit | Attending: Family Medicine | Admitting: Family Medicine

## 2020-11-29 DIAGNOSIS — R1011 Right upper quadrant pain: Secondary | ICD-10-CM | POA: Insufficient documentation

## 2020-11-29 DIAGNOSIS — R1010 Upper abdominal pain, unspecified: Secondary | ICD-10-CM

## 2020-11-29 DIAGNOSIS — R1904 Left lower quadrant abdominal swelling, mass and lump: Secondary | ICD-10-CM | POA: Diagnosis present

## 2020-11-29 DIAGNOSIS — G8929 Other chronic pain: Secondary | ICD-10-CM | POA: Insufficient documentation

## 2020-12-07 ENCOUNTER — Other Ambulatory Visit: Payer: Self-pay

## 2020-12-07 ENCOUNTER — Telehealth: Payer: Self-pay

## 2020-12-07 ENCOUNTER — Ambulatory Visit: Payer: 59 | Attending: Family Medicine | Admitting: Physical Therapy

## 2020-12-07 DIAGNOSIS — M25552 Pain in left hip: Secondary | ICD-10-CM | POA: Diagnosis not present

## 2020-12-07 DIAGNOSIS — R29818 Other symptoms and signs involving the nervous system: Secondary | ICD-10-CM | POA: Insufficient documentation

## 2020-12-07 DIAGNOSIS — R29898 Other symptoms and signs involving the musculoskeletal system: Secondary | ICD-10-CM | POA: Diagnosis present

## 2020-12-07 DIAGNOSIS — M6281 Muscle weakness (generalized): Secondary | ICD-10-CM | POA: Insufficient documentation

## 2020-12-07 NOTE — Telephone Encounter (Signed)
Needs ultrasound results  Needs a physical therapy referral due to hand injury

## 2020-12-07 NOTE — Therapy (Signed)
[facility] [address]  [address] [place], [place], [address] Phone: [phone]   Fax:  [phone]  Physical Therapy Evaluation  Patient Details  Name: Jeffery Reyes MRN: [id number] Date of Birth: 04/06/1961 Referring Provider (PT): Raliegh Ip, FNP   Encounter Date: 12/07/2020   PT End of Session - 12/07/20 0805    Visit Number 1    Number of Visits 12    Date for PT Re-Evaluation 01/18/21    Authorization Type [employer]    PT Start Time 0805    PT Stop Time 0858    PT Time Calculation (min) 53 min    Activity Tolerance Patient tolerated treatment well    Behavior During Therapy [facility] for tasks assessed/performed           Past Medical History:  Diagnosis Date  . Elevated cholesterol 02/2020  . GERD (gastroesophageal reflux disease) 02/2020  . High cholesterol   . Kidney stones   . Peripheral neuropathy 02/2020  . Proteinuria   . Urinary urgency 02/2020  . Urinary urgency   . Visual problems 02/2020  . Vitamin D deficiency 02/2020    Past Surgical History:  Procedure Laterality Date  . nerve release hand Left 2015  . REPAIR / RECONSTRUCTION COLLATERAL LIGAMENT HAND Left     There were no vitals filed for this visit.    Subjective Assessment - 12/07/20 0809    Subjective Pt reports L hip and leg pain after sitting for long periods. Denies pain in low back or foot. Unknown MOI with gradual onset in 2015-2016. Also notes pain in R elbow & forearm and asks if PT could help with this.    Patient is accompained by: Interpreter    Limitations Sitting    How long can you sit comfortably? 1-2 hrs    Diagnostic tests L hip x-ray 10/12/20: negative    Patient Stated Goals "to be normal"    Currently in Pain? Yes    Pain Score 3    up to 9/10 at worst   Pain Location Hip    Pain Orientation Left    Pain Descriptors / Indicators Other (Comment)   "nerve pain"   Pain Type Chronic pain;Neuropathic pain     Pain Radiating Towards pain down L LE w/o foot; intermittent numbness    Pain Onset Other (comment)   2015-2016   Pain Frequency Intermittent    Aggravating Factors  laying in bed; prolonged sitting    Pain Relieving Factors stretching; standing or walking    Effect of Pain on Daily Activities limited sitting tolerance; difficulty getting comfortable at bedtime              [facility] PT Assessment - 12/07/20 0805      Assessment   Medical Diagnosis L hip & leg pain    Referring Provider (PT) Raliegh Ip, FNP    Onset Date/Surgical Date --   2015-2016   Hand Dominance Right    Next MD Visit ~2 weeks    [place] Therapy [place] ~7-9 yrs ago      Precautions   Precautions None      Restrictions   Weight Bearing Restrictions No      Balance Screen   Has the patient fallen in the past 6 months No    Has the patient had a decrease in activity level because of a fear of falling?  No    Is the patient reluctant to  leave their home because of a fear of falling?  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    Leisure "all the time busy" - house and yard work; ~2 yrs ago used to exercise every night for 22 yrs (jogging) - stopped due to lack of time      Cognition   Overall Cognitive Status Within Functional Limits for tasks assessed      ROM / Strength   AROM / PROM / Strength AROM;Strength      AROM   Overall AROM Comments Lumbar ROM WNL; hip ER/IR limited L>R    AROM Assessment Site Lumbar;Hip      Strength   Strength Assessment Site Hip;Knee;Ankle    Right/Left Hip Right;Left    Right Hip Flexion 5/5    Right Hip Extension 5/5    Right Hip  External Rotation  4+/5    Right Hip Internal Rotation 5/5    Right Hip ABduction 5/5    Right Hip ADduction 5/5    Left Hip Flexion 5/5    Left Hip Extension 4+/5    Left Hip External Rotation 4/5    Left Hip Internal Rotation 4+/5    Left Hip ABduction 4+/5    Left Hip ADduction 4+/5    Right/Left Knee Right;Left    Right Knee Flexion 5/5    Right Knee Extension 5/5    Left Knee Flexion 5/5    Left Knee Extension 5/5    Right/Left Ankle Right;Left    Right Ankle Dorsiflexion 5/5    Right Ankle Plantar Flexion 5/5    Left Ankle Dorsiflexion 4+/5    Left Ankle Plantar Flexion 4+/5      Flexibility   Soft Tissue Assessment /Muscle Length yes    Hamstrings mild tight L>R    Quadriceps mild tight L>R    ITB mild tight L>R    Piriformis mild/mod tight L>R    Obturator Internus mild tight L>R      Palpation   Palpation comment increased muscle tension with mild TTP over L>R glutes & piriformis; mild increased muscle tension in L>R lumbar paraspinals & QL but denies TTP                      Objective measurements completed on examination: See above findings.               PT Education - 12/07/20 0850    Education Details PT eval findings, aniticipated POC & initial HEP - Access Code: [email or web address]    Person(s) Educated Patient    Methods Explanation;Demonstration;Verbal cues;Handout    Comprehension Verbalized understanding;Verbal cues required;Returned demonstration;Need further instruction            PT Short Term Goals - 12/07/20 0858      PT SHORT TERM GOAL #1   Title Patient will be independent with initial HEP    Status New    Target Date 12/21/20             PT Long Term Goals - 12/07/20 3220  PT LONG TERM GOAL #1   Title Patient will be independent with ongoing/advanced HEP for self-management at home    Status New    Target Date 01/18/21      PT LONG TERM GOAL #2   Title Patient to demonstrate improved tissue quality and  pliability as noted by reduced tissue tightness and tenderness to palpation    Status New      PT LONG TERM GOAL #3   Title Decrease L hip/LE pain by >/= 50-75% allowing for improved sitting tolerance and decreased sleep interference    Status New    Target Date 01/18/21      PT LONG TERM GOAL #4   Title Patient will demonstrate improved L hip & ankle strength to 5/5 for improved stability and ease of mobility    Status New    Target Date 01/18/21      PT LONG TERM GOAL #5   Title Patient will report no sleep disturbance or limitation with sitting tolernace due to L hip or LE pain    Status New    Target Date 01/18/21                  Plan - 12/07/20 0858    Clinical Impression Statement Farrel ConnersKamal is a 60 y/o male who presents to OP PT for chronic L hip and LE pain with polyneuropathy. He reports onset of pain in 2015-2016 with gradual onset and no known MOI. Pain typically worse after prolonged sitting or when trying to get comfortable in bed when he is trying to go to sleep. Deficits include intermittent L hip and LE pain which does not extend into low back or foot, mildly decreased L hip ROM with mild to moderately limited proximal flexibility L>R, mild decreased L hip and ankle strength. Pain limits positional tolerance for sitting and interferes with ability to fall asleep at night. Farrel ConnersKamal will benefit from skilled PT to address above deficits to reduce or eliminate L hip and LE pain and restore functional L hip ROM/flexibility and LE strength to decrease pain inference with positional tolerance or sleep. Farrel ConnersKamal also inquiring if PT could help with his R elbow pain and limited grip strength/control in R hand - he plans to follow up with MD/NP and will inquire about PT referral.    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, peripheral neuropathy, GERD, HLD, vitamin D deficiency    Examination-Activity Limitations Sit;Sleep    Examination-Participation Restrictions Driving    Stability/Clinical Decision Making Stable/Uncomplicated    Clinical Decision Making Low    Rehab Potential Good    PT Frequency 2x / week    PT Duration 6 weeks    PT Treatment/Interventions ADLs/Self Care Home Management;Cryotherapy;Electrical Stimulation;Iontophoresis 4mg /ml Dexamethasone;Moist Heat;Ultrasound;Functional mobility training;Therapeutic activities;Therapeutic exercise;Neuromuscular re-education;Manual techniques;Passive range of motion;Dry needling;Taping    PT Next Visit Plan Review initial HEP; progress proximal LE flexibility and strengthening; manual therapy to address increased muscle tension in L glutes/piriformis; modalities PRN    PT Home Exercise Plan MedBridge Access Code: [email or web address] ([email or web address])    Consulted and Agree with Plan of Care Patient           Patient will benefit from skilled therapeutic intervention in order to improve the following deficits and impairments:  Decreased activity tolerance,Decreased range of motion,Decreased strength,Increased fascial restricitons,Increased muscle spasms,Impaired flexibility,Pain  Visit Diagnosis: Pain  in left hip - Plan: PT plan of care cert/re-cert  Other symptoms and signs involving the musculoskeletal system - Plan: PT plan of care cert/re-cert  Other symptoms and signs involving the nervous system - Plan: PT plan of care cert/re-cert  Muscle weakness (generalized) - Plan: PT plan of care cert/re-cert     Problem List Patient Active Problem List   Diagnosis Date Noted  . Urinary urgency 03/01/2020  . Hyperglycemia 03/01/2020  . Vision problems 03/01/2020  . Gastroesophageal reflux disease without esophagitis 03/01/2020  . Carpal tunnel syndrome, right 06/06/2019  . Upper abdominal pain 11/11/2016  . Left hip pain 11/11/2016  . Arthralgia  11/11/2016  . Dyslipidemia 09/23/2014  . Vitamin D deficiency 09/04/2014  . Neutropenia (HCC) 09/04/2014    Marry Guan, PT, MPT 12/07/2020, 9:46 AM  [facility] [address]  [address] [place], [place], [address] Phone: [phone]   Fax:  [phone]  Name: Kane Kusek MRN: [id number] Date of Birth: [birth year]

## 2020-12-07 NOTE — Telephone Encounter (Signed)
This patient of natalie that had an u/s done wants to know the results.

## 2020-12-07 NOTE — Patient Instructions (Signed)
     Access Code: [email or web address] URL: [email or web address] Date: 12/07/2020 Prepared by: Glenetta Hew  Exercises Supine Single Knee to Chest Stretch - 2-3 x daily - 7 x weekly - 3 reps - 30 sec hold Supine Piriformis Stretch with Leg Straight - 2-3 x daily - 7 x weekly - 3 reps - 30 sec hold Supine Figure 4 Piriformis Stretch with Leg Extension - 2-3 x daily - 7 x weekly - 3 reps - 30 sec hold Hooklying Hamstring Stretch with Strap - 2-3 x daily - 7 x weekly - 3 reps - 30 sec hold Supine ITB Stretch with Strap - 2-3 x daily - 7 x weekly - 3 reps - 30 sec hold

## 2020-12-08 NOTE — Telephone Encounter (Signed)
Abdominal ultrasound was negative Does he need a referral for physical therapy?

## 2020-12-08 NOTE — Telephone Encounter (Signed)
Called and spoke w/ patient informed patient  his results. Pt has appt with physical therapy , pt has rt arm pain

## 2020-12-14 ENCOUNTER — Other Ambulatory Visit: Payer: Self-pay

## 2020-12-14 ENCOUNTER — Ambulatory Visit: Payer: 59

## 2020-12-14 DIAGNOSIS — M25552 Pain in left hip: Secondary | ICD-10-CM

## 2020-12-14 DIAGNOSIS — R29898 Other symptoms and signs involving the musculoskeletal system: Secondary | ICD-10-CM

## 2020-12-14 DIAGNOSIS — R29818 Other symptoms and signs involving the nervous system: Secondary | ICD-10-CM

## 2020-12-14 DIAGNOSIS — M6281 Muscle weakness (generalized): Secondary | ICD-10-CM

## 2020-12-14 NOTE — Therapy (Addendum)
[facility] [address]  [place], [place], [address] Phone: [phone]   Fax:  [phone]  Physical Therapy Treatment / Discharge Summary  Patient Details  Name: Jeffery Reyes MRN: [id number] Date of Birth: Dec 26, 1960 Referring Provider (PT): Kathe Becton, FNP   Encounter Date: 12/14/2020   PT End of Session - 12/14/20 1451     Visit Number 2    Number of Visits 12    Date for PT Re-Evaluation 01/18/21    Authorization Type [employer]    PT Start Time 1354    PT Stop Time 1443    PT Time Calculation (min) 49 min    Activity Tolerance Patient tolerated treatment well    Behavior During Therapy [facility] for tasks assessed/performed             Past Medical History:  Diagnosis Date   Elevated cholesterol 02/2020   GERD (gastroesophageal reflux disease) 02/2020   High cholesterol    Kidney stones    Peripheral neuropathy 02/2020   Proteinuria    Urinary urgency 02/2020   Urinary urgency    Visual problems 02/2020   Vitamin D deficiency 02/2020    Past Surgical History:  Procedure Laterality Date   nerve release hand Left 2015   REPAIR / RECONSTRUCTION COLLATERAL LIGAMENT HAND Left     There were no vitals filed for this visit.   Subjective Assessment - 12/14/20 1351     Subjective Hip is doing good. States he doesn't have a lot of time to do exercises.    Patient is accompained by: Interpreter    Diagnostic tests L hip x-ray 10/12/20: negative    Patient Stated Goals "to be normal"    Currently in Pain? No/denies                               [facility] Adult PT Treatment/Exercise - 12/14/20 0001       Exercises   Exercises Knee/Hip      Knee/Hip Exercises: Stretches   ITB Stretch Left;2 reps;30 seconds    ITB Stretch Limitations supine with strap    Piriformis Stretch Right;Left;2 reps;30 seconds    Piriformis Stretch Limitations KTOS supine    Other Knee/Hip  Stretches single knee to chest stretch 2x30 sec    Other Knee/Hip Stretches hip abd stretch 30 sec L side      Knee/Hip Exercises: Aerobic   Recumbent Bike [id number]      Knee/Hip Exercises: Supine   Bridges Strengthening;Both;1 set;10 reps    Straight Leg Raises Strengthening;Left;1 set;10 reps      Knee/Hip Exercises: Sidelying   Hip ABduction Strengthening;Left;1 set;10 reps      Manual Therapy   Manual Therapy Soft tissue mobilization;Myofascial release    Soft tissue mobilization DTM B thoracolumbar paraspinals, L peronel muscles    Myofascial Release manual TPR to B thoracolumbar paraspinals                      PT Short Term Goals - 12/14/20 1356       PT SHORT TERM GOAL #1   Title Patient will be independent with initial HEP    Status On-going    Target Date 12/21/20               PT Long Term Goals - 12/14/20 1356       PT LONG TERM  GOAL #1   Title Patient will be independent with ongoing/advanced HEP for self-management at home    Status On-going      PT LONG TERM GOAL #2   Title Patient to demonstrate improved tissue quality and pliability as noted by reduced tissue tightness and tenderness to palpation    Status On-going      PT LONG TERM GOAL #3   Title Decrease L hip/LE pain by >/= 50-75% allowing for improved sitting tolerance and decreased sleep interference    Status On-going      PT LONG TERM GOAL #4   Title Patient will demonstrate improved L hip & ankle strength to 5/5 for improved stability and ease of mobility    Status On-going      PT LONG TERM GOAL #5   Title Patient will report no sleep disturbance or limitation with sitting tolernace due to L hip or LE pain    Status On-going                   Plan - 12/14/20 1453     Clinical Impression Statement Pt reported to clinic with no reports of pain in L LE. Had c/o of mild pain with the stretches and exercises in L low back/glutes area but tolerable, also reporting  numbness at times in L Leg and low back. He showed a good response to session, gave instructions on avoiding painful ROM during stretches, and on keeping knees bent and elevated while lying down to help low back and hips. Good response during MT, with pt noting less pain and feeling better afterward.    Personal Factors and Comorbidities Time since onset of injury/illness/exacerbation;Past/Current Experience;Comorbidity 3+    Comorbidities R lateral epicondylitis, R CTS, L CTS s/p CTR & trigger finger/nerve release surgeries, peripheral neuropathy, GERD, HLD, vitamin D deficiency    PT Frequency 2x / week    PT Duration 6 weeks    PT Treatment/Interventions ADLs/Self Care Home Management;Cryotherapy;Electrical Stimulation;Iontophoresis 60m/ml Dexamethasone;Moist Heat;Ultrasound;Functional mobility training;Therapeutic activities;Therapeutic exercise;Neuromuscular re-education;Manual techniques;Passive range of motion;Dry needling;Taping    PT Next Visit Plan progress proximal LE flexibility and strengthening; manual therapy to address increased muscle tension in L glutes/piriformis; modalities PRN    PT Home Exercise Plan MedBridge Access Code: [email or web address] ([email or web address])    Consulted and Agree with Plan of Care Patient             Patient will benefit from skilled therapeutic intervention in order to improve the following deficits and impairments:  Decreased activity tolerance,Decreased range of motion,Decreased strength,Increased fascial restricitons,Increased muscle spasms,Impaired flexibility,Pain  Visit Diagnosis: Pain in left hip  Other symptoms and signs involving the musculoskeletal system  Other symptoms and signs involving the nervous system  Muscle weakness (generalized)     Problem List Patient Active Problem List   Diagnosis Date Noted   Urinary urgency 03/01/2020   Hyperglycemia 03/01/2020   Vision problems 03/01/2020   Gastroesophageal reflux disease without esophagitis  03/01/2020   Carpal tunnel syndrome, right 06/06/2019   Upper abdominal pain 11/11/2016   Left hip pain 11/11/2016   Arthralgia 11/11/2016   Dyslipidemia 09/23/2014   Vitamin D deficiency 09/04/2014   Neutropenia ([place] 09/04/2014    BArtist Pais PTA 12/14/2020, 3:01 PM  [facility] [address] [place] [address]: [phone]  Fax:  [phone] Name: Jeffery VallezMRN: [id number] of Birth: 703-24-1962   PHYSICAL THERAPY DISCHARGE  SUMMARY  Visits from Start of Care: 2  Current functional level related to goals / functional outcomes:   Refer to above clinical impression for status as of last visit on 12/14/2020. Patient cancelled 4 appointment and no showed for 1 appointment. He has not returned to PT in >30 days, therefore will proceed with discharge from PT for this episode.   Remaining deficits:   As above. Unable to formally assess status at discharge due to failure to return to PT.   Education / Equipment:   Initial HEP   Patient agrees to discharge. Patient goals were not met. Patient is being discharged due to not returning since the last visit.   Percival Spanish, PT, MPT 02/25/21, 10:21 AM  [facility] [place], [place], [address] Phone: [phone]   Fax:  [phone]

## 2020-12-17 ENCOUNTER — Ambulatory Visit: Payer: 59

## 2020-12-21 ENCOUNTER — Ambulatory Visit: Payer: 59

## 2020-12-23 ENCOUNTER — Ambulatory Visit: Payer: 59 | Admitting: Nurse Practitioner

## 2020-12-23 ENCOUNTER — Ambulatory Visit: Payer: 59 | Attending: Family Medicine

## 2020-12-28 ENCOUNTER — Ambulatory Visit: Payer: 59 | Admitting: Physical Therapy

## 2020-12-30 ENCOUNTER — Ambulatory Visit: Payer: 59

## 2021-03-01 ENCOUNTER — Encounter: Payer: 59 | Admitting: Family Medicine

## 2021-03-02 ENCOUNTER — Encounter: Payer: 59 | Admitting: Nurse Practitioner

## 2021-06-15 IMAGING — US US ABDOMEN COMPLETE
1 series · 14 of 25 positions shown · non-contrast
Comparison: Ultrasound abdomen 11/07/2016

CLINICAL DATA: Abdominal pain for 10 years. Left upper quadrant
swelling/lump for 30 years.

EXAM:
ABDOMEN ULTRASOUND COMPLETE

[Series 1: us abdomen complete · 14 of 94 slices shown]
[im 1/94]
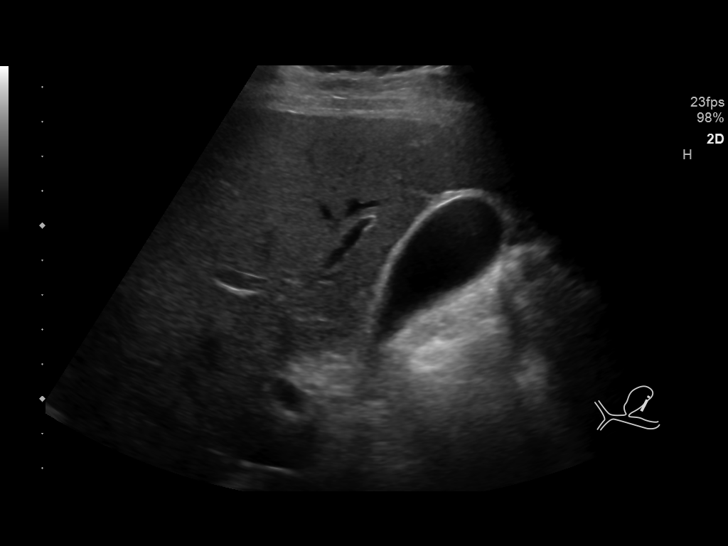
[im 8/94]
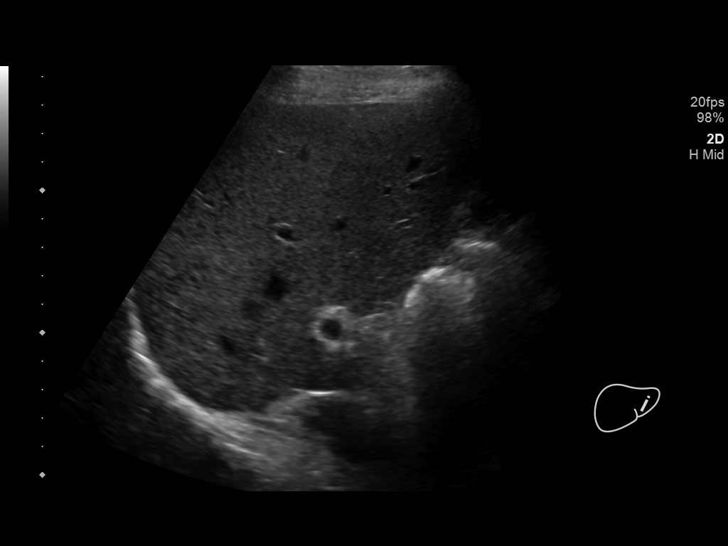
[im 16/94]
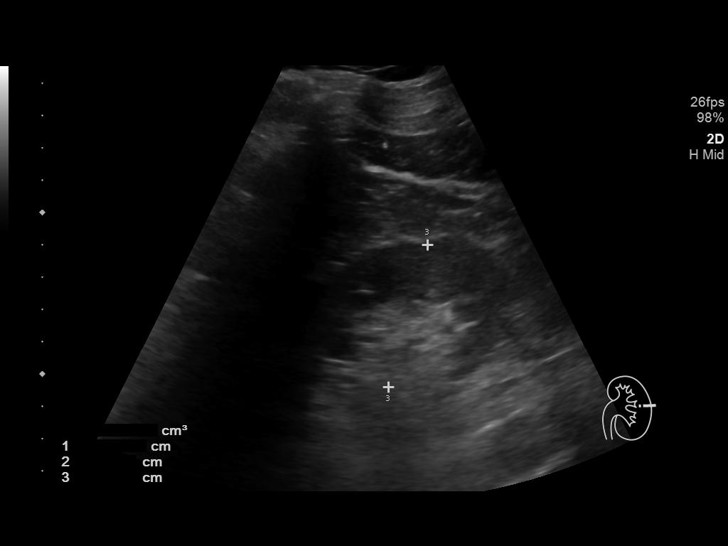
[im 24/94]
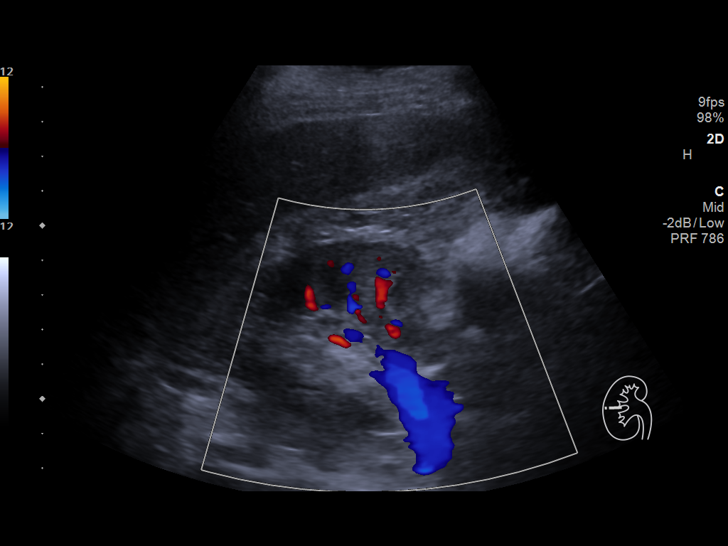
[im 32/94]
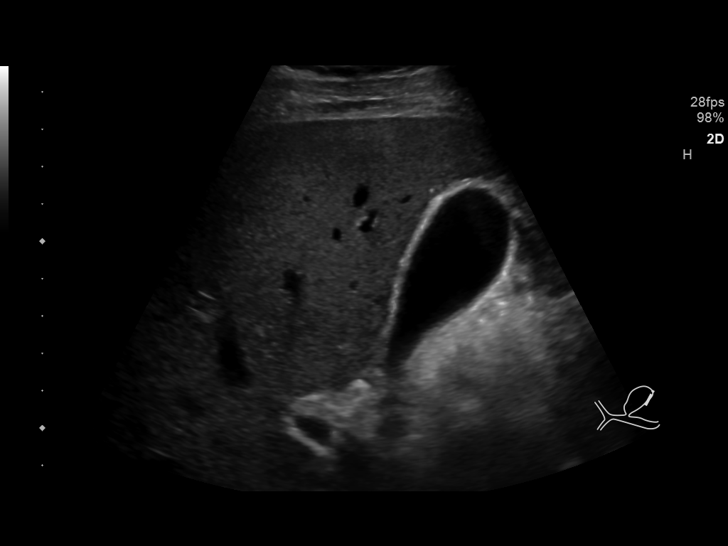
[im 35/94]
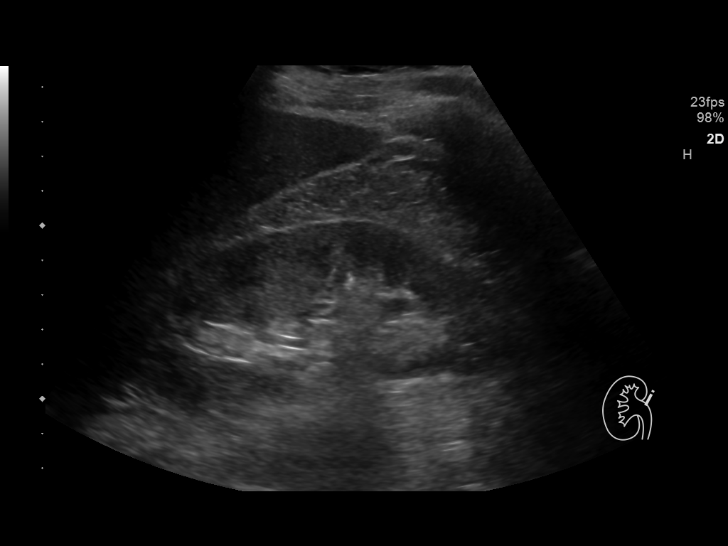
[im 43/94]
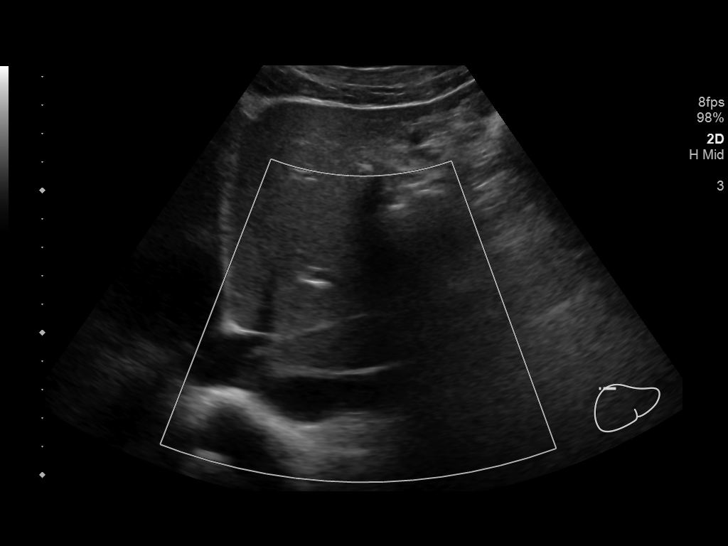
[im 51/94]
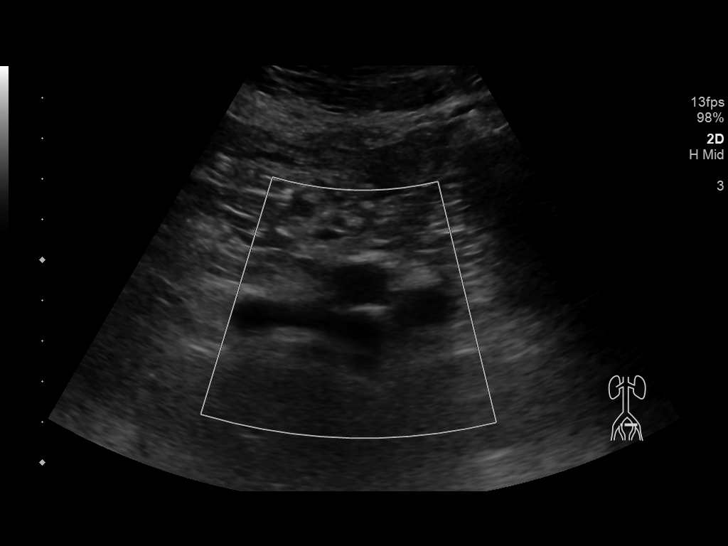
[im 59/94]
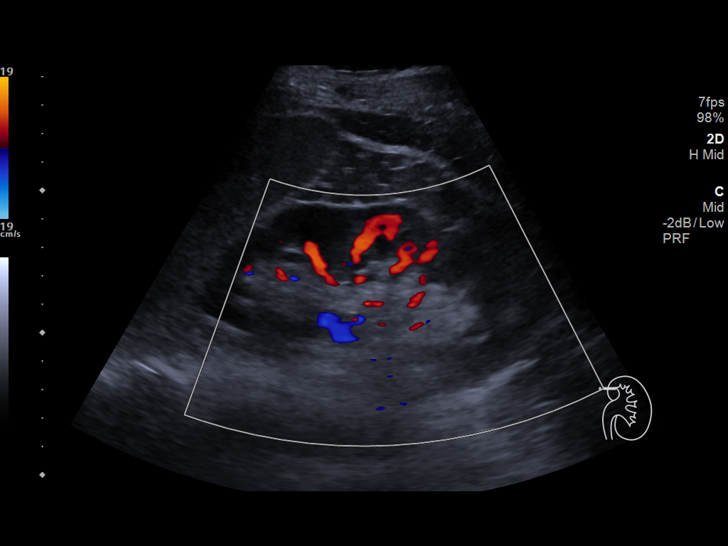
[im 63/94]
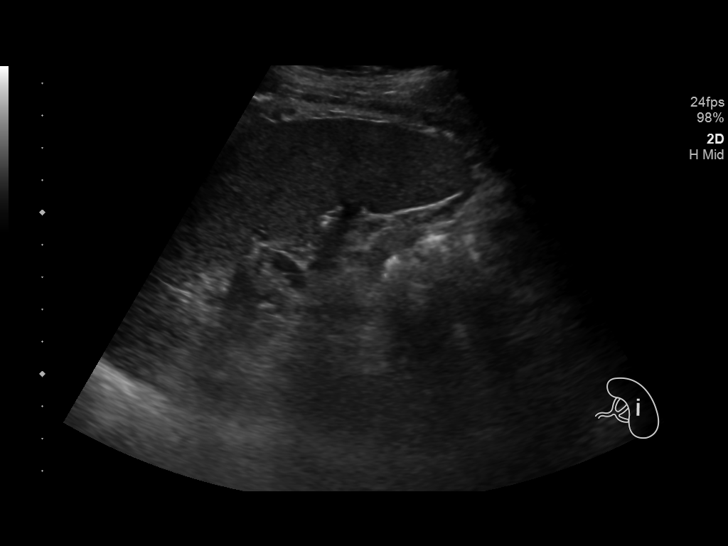
[im 70/94]
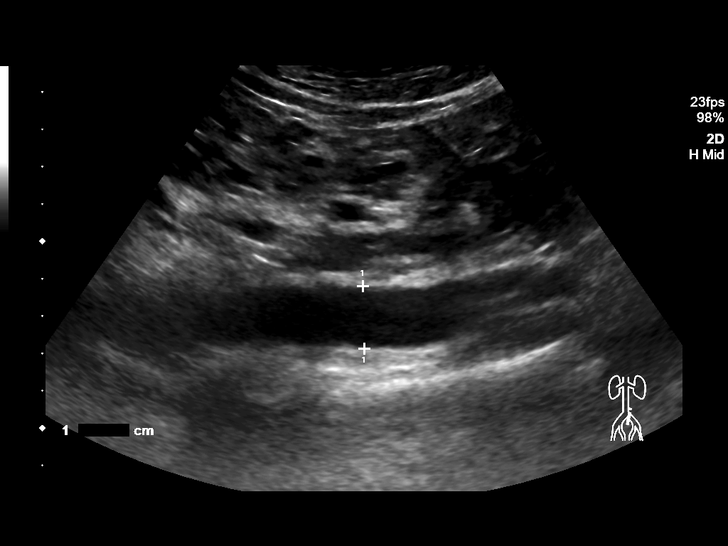
[im 78/94]
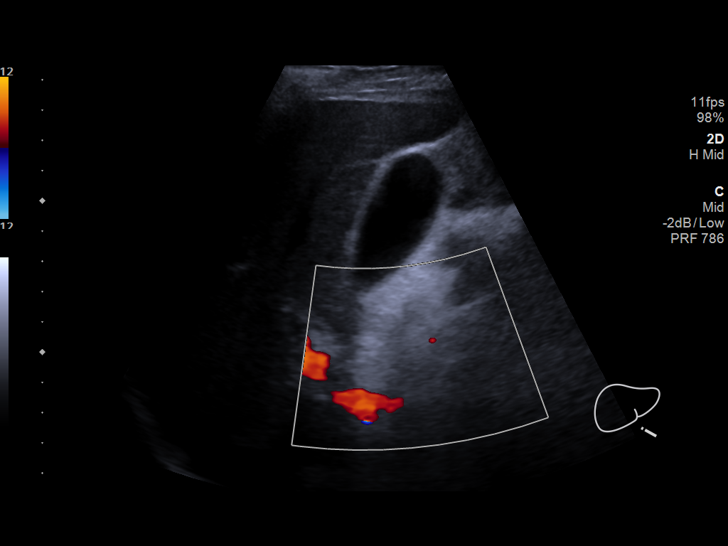
[im 86/94]
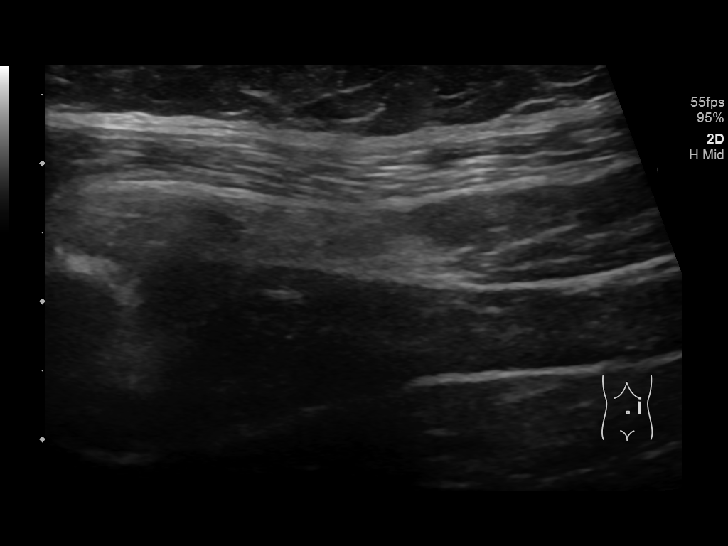
[im 94/94]
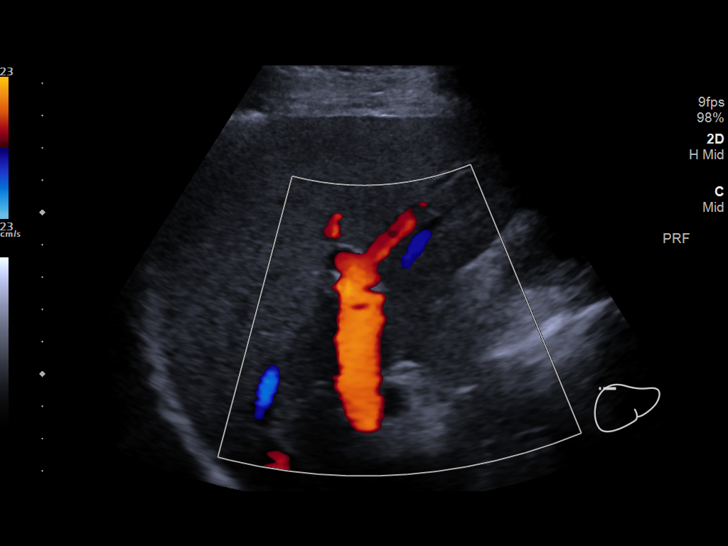

[14 of 25 positions shown; findings below may reference images not displayed]

FINDINGS: Gallbladder: No gallstones or wall thickening visualized. No
sonographic Murphy sign noted by sonographer.

Common bile duct: Diameter: 4 mm

Liver: No focal lesion identified. Within normal limits in
parenchymal echogenicity. Portal vein is patent on color Doppler
imaging with normal direction of blood flow towards the liver.

IVC: No abnormality visualized.

Pancreas: Visualized portion unremarkable.

Spleen: Size and appearance within normal limits.

Right Kidney: Length: 10.8 cm. Echogenicity within normal limits. No
mass or hydronephrosis visualized.

Left Kidney: Length: 10.7 cm. Echogenicity within normal limits. No
mass or hydronephrosis visualized.

Abdominal aorta: No aneurysm visualized. Limited visualization due
to shadowing bowel gas.

Other findings: No discrete sonographic abnormality identified on
targeted evaluation of the area of palpable abnormality in the left
upper quadrant.
IMPRESSION: Unremarkable sonographic evaluation of the abdomen.

## 2021-08-17 ENCOUNTER — Other Ambulatory Visit: Payer: Self-pay

## 2021-08-17 ENCOUNTER — Ambulatory Visit (INDEPENDENT_AMBULATORY_CARE_PROVIDER_SITE_OTHER): Payer: 59 | Admitting: Nurse Practitioner

## 2021-08-17 VITALS — BP 145/87 | HR 67 | Temp 98.4°F | Ht 71.0 in | Wt 217.1 lb

## 2021-08-17 DIAGNOSIS — H5712 Ocular pain, left eye: Secondary | ICD-10-CM | POA: Diagnosis not present

## 2021-08-17 DIAGNOSIS — H93232 Hyperacusis, left ear: Secondary | ICD-10-CM

## 2021-08-17 DIAGNOSIS — H9202 Otalgia, left ear: Secondary | ICD-10-CM | POA: Diagnosis not present

## 2021-08-17 DIAGNOSIS — H539 Unspecified visual disturbance: Secondary | ICD-10-CM

## 2021-08-17 DIAGNOSIS — E7849 Other hyperlipidemia: Secondary | ICD-10-CM | POA: Diagnosis not present

## 2021-08-17 MED ORDER — OLOPATADINE HCL 0.1 % OP SOLN: 1.0000 [drp] | Freq: Two times a day (BID) | OPHTHALMIC | 2 refills | Status: DC | PRN

## 2021-08-17 NOTE — Progress Notes (Signed)
[name], [place]  [address] Phone:  [phone]   Fax:  [phone] Subjective:   Patient ID: Jeffery Reyes, male    DOB: 05-05-1961, 59 y.o.   MRN: [id number]  Chief Complaint  Patient presents with   Follow-up    Pt stated he has been having LT ear pain and LT eye is watery and painful. Pt hasn't been taking any of his medications.   HPI [facility] 60 y.o. male  has a past medical history of Elevated cholesterol (02/2020), GERD (gastroesophageal reflux disease) (02/2020), High cholesterol, Kidney stones, Peripheral neuropathy (02/2020), Proteinuria, Urinary urgency (02/2020), Urinary urgency, Visual problems (02/2020), and Vitamin D deficiency (02/2020). To the [facility] for left eye pain and left ear pain.  Patient states that he has had symptoms for almost 1 yr. Left eye pain increases while reading items that are close. Also endorses increased tearing. Has prescribed glasses, but only for seeing items that are far. Has not taken any medications for symptoms or been evaluated in the past. Endorses mild pain during visit. Denies any recent trauma or injury.  Also complaining of intermittent left ear pain, denies any during visit. States that it feels like he has something in his left ear. Symptoms typically worsen when transitioning from bending forward to standing. Has had some indescribable changes in hearing. Denies any dizziness or HA. Denies any recent trauma or injury. Denies any drainage from ears. Has not had similar symptoms in the past.  Has not been evaluated for symptoms.  Denies any other complaints. Currently not compliant with any medications, states that his goal was to improve cholesterol with lifestyle changes. Denies any fatigue, chest pain, shortness of breath, HA or dizziness. Denies any blurred vision, numbness or tingling.   Past Medical History:  Diagnosis Date   Elevated cholesterol 02/2020   GERD (gastroesophageal  reflux disease) 02/2020   High cholesterol    Kidney stones    Peripheral neuropathy 02/2020   Proteinuria    Urinary urgency 02/2020   Urinary urgency    Visual problems 02/2020   Vitamin D deficiency 02/2020    Past Surgical History:  Procedure Laterality Date   nerve release hand Left 2015   REPAIR / RECONSTRUCTION COLLATERAL LIGAMENT HAND Left     Family History  Problem Relation Age of Onset   Hypertension Father    Stroke Brother     Social History   Socioeconomic History   Marital status: Married    Spouse name: Not on file   Number of children: Not on file   Years of education: Not on file   Highest education level: Not on file  Occupational History   Not on file  Tobacco Use   Smoking status: Former   Smokeless tobacco: Never  Substance and Sexual Activity   Alcohol use: No    Alcohol/week: 0.0 standard drinks   Drug use: No   Sexual activity: Not on file  Other Topics Concern   Not on file  Social History Narrative   Not on file   Social Determinants of Health   Financial Resource Strain: Not on file  Food Insecurity: Not on file  Transportation Needs: Not on file  Physical Activity: Not on file  Stress: Not on file  Social Connections: Not on file  Intimate Partner Violence: Not on file    Outpatient Medications Prior to Visit  Medication Sig Dispense Refill   cetirizine (ZYRTEC) 10 MG tablet  Take 1 tablet (10 mg total) by mouth daily. (Patient not taking: Reported on 08/17/2021) 90 tablet 3   Cholecalciferol (VITAMIN D) 2000 units CAPS Take by mouth.  (Patient not taking: Reported on 08/17/2021)     gabapentin (NEURONTIN) 300 MG capsule Take 1 capsule (300 mg total) by mouth at bedtime. (Patient not taking: Reported on 08/17/2021) 90 capsule 3   omeprazole (PRILOSEC) 20 MG capsule Take 1 capsule (20 mg total) by mouth daily. (Patient not taking: Reported on 08/17/2021) 90 capsule 3   oxybutynin (DITROPAN XL) 5 MG 24 hr tablet Take 1 tablet (5  mg total) by mouth at bedtime. (Patient not taking: Reported on 08/17/2021) 90 tablet 3   atorvastatin (LIPITOR) 20 MG tablet Take 1 tablet (20 mg total) by mouth daily. (Patient not taking: Reported on 08/17/2021) 90 tablet 3   No facility-administered medications prior to visit.    No Known Allergies  Review of Systems  Constitutional:  Negative for chills, fever and malaise/fatigue.  HENT:  Positive for ear pain. Negative for congestion, ear discharge, hearing loss, nosebleeds, sinus pain, sore throat and tinnitus.   Eyes:  Positive for pain and discharge. Negative for blurred vision, double vision, photophobia and redness.  Respiratory:  Negative for cough, shortness of breath and stridor.   Cardiovascular:  Negative for chest pain, palpitations and leg swelling.  Gastrointestinal:  Negative for abdominal pain, blood in stool, constipation, diarrhea, nausea and vomiting.  Skin: Negative.   Neurological: Negative.   Psychiatric/Behavioral:  Negative for depression. The patient is not nervous/anxious.   All other systems reviewed and are negative.     Objective:    Physical Exam Vitals reviewed.  Constitutional:      General: He is not in acute distress.    Appearance: Normal appearance. He is normal weight.  HENT:     Head: Normocephalic.     Right Ear: Tympanic membrane, ear canal and external ear normal.     Left Ear: Tympanic membrane, ear canal and external ear normal.     Nose: Nose normal.     Mouth/Throat:     Mouth: Mucous membranes are moist.     Pharynx: Oropharynx is clear.  Eyes:     Extraocular Movements: Extraocular movements intact.     Conjunctiva/sclera: Conjunctivae normal.     Pupils: Pupils are equal, round, and reactive to light.  Neck:     Vascular: No carotid bruit.  Cardiovascular:     Rate and Rhythm: Normal rate and regular rhythm.     Pulses: Normal pulses.     Heart sounds: Normal heart sounds.     Comments: No obvious peripheral  edema Pulmonary:     Effort: Pulmonary effort is normal.     Breath sounds: Normal breath sounds.  Musculoskeletal:     Cervical back: Normal range of motion and neck supple. No rigidity or tenderness.  Lymphadenopathy:     Cervical: No cervical adenopathy.  Skin:    General: Skin is warm and dry.     Capillary Refill: Capillary refill takes less than 2 seconds.  Neurological:     General: No focal deficit present.     Mental Status: He is alert and oriented to person, place, and time.  Psychiatric:        Mood and Affect: Mood normal.        Behavior: Behavior normal.        Thought Content: Thought content normal.  Judgment: Judgment normal.    BP (!) 145/87    Pulse 67    Temp 98.4 F (36.9 C)    Ht 5\' 11"  (1.803 m)    Wt 217 lb 2 oz (98.5 kg)    SpO2 100%    BMI 30.28 kg/m  Wt Readings from Last 3 Encounters:  08/17/21 217 lb 2 oz (98.5 kg)  11/22/20 207 lb (93.9 kg)  10/12/20 206 lb (93.4 kg)    Immunization History  Administered Date(s) Administered   Influenza,inj,Quad PF,6+ Mos 08/25/2014   Tdap 08/25/2014    Diabetic Foot Exam - Simple   No data filed     Lab Results  Component Value Date   TSH 2.200 03/01/2020   Lab Results  Component Value Date   WBC 3.1 (L) 03/01/2020   HGB 15.9 03/01/2020   HCT 45.4 03/01/2020   MCV 89 03/01/2020   PLT 219 03/01/2020   Lab Results  Component Value Date   NA 141 03/01/2020   K 4.1 03/01/2020   CO2 23 03/01/2020   GLUCOSE 96 03/01/2020   BUN 16 03/01/2020   CREATININE 0.91 03/01/2020   BILITOT 0.4 03/01/2020   ALKPHOS 55 03/01/2020   AST 16 03/01/2020   ALT 12 03/01/2020   PROT 6.8 03/01/2020   ALBUMIN 4.5 03/01/2020   CALCIUM 9.5 03/01/2020   Lab Results  Component Value Date   CHOL 249 (H) 08/17/2021   CHOL 202 (H) 03/01/2020   CHOL 208 (H) 11/10/2016   Lab Results  Component Value Date   HDL 49 08/17/2021   HDL 51 03/01/2020   HDL 45 11/10/2016   Lab Results  Component Value Date    LDLCALC 164 (H) 08/17/2021   LDLCALC 127 (H) 03/01/2020   LDLCALC 124 (H) 11/10/2016   Lab Results  Component Value Date   TRIG 195 (H) 08/17/2021   TRIG 137 03/01/2020   TRIG 197 (H) 11/10/2016   Lab Results  Component Value Date   CHOLHDL 5.1 (H) 08/17/2021   CHOLHDL 4.0 03/01/2020   CHOLHDL 4.6 11/10/2016   Lab Results  Component Value Date   HGBA1C 5.6 03/01/2020   HGBA1C 5.6 03/01/2020   HGBA1C 5.6 (A) 03/01/2020   HGBA1C 5.6 03/01/2020       Assessment & Plan:   Problem List Items Addressed This Visit   None Visit Diagnoses     Left ear pain    -  Primary Benign and reassuring PE Informed to take OTC medications as needed for pain management  Referral to ENT completed    Left eye pain       Relevant Medications   Suspect pain related to dry eye, eye drops ordered olopatadine (PATADAY) 0.1 % ophthalmic solution   Other Relevant Orders   Visual acuity screening   Other hyperlipidemia       Relevant Orders   POCT glycosylated hemoglobin (Hb A1C)   Lipid panel (Completed)   Hearing abnormally acute, left       Relevant Orders   Ambulatory referral to ENT   Visual changes       Relevant Orders   Visual acuity screening: completed and wnl, right eye 20/20, left eye 20/25, and both eyes 20/20   Follow up in 3 mths for annual wellness visit and reevaluation of symptoms, sooner as needed     I am having [facility] start on olopatadine. I am also having him maintain his Vitamin D, cetirizine, omeprazole, oxybutynin, and gabapentin.  Meds ordered this encounter  Medications   olopatadine (PATADAY) 0.1 % ophthalmic solution    Sig: Place 1 drop into the left eye 2 (two) times daily as needed (dry eyes).    Dispense:  5 mL    Refill:  2     Kathrynn Speed, NP

## 2021-08-17 NOTE — Patient Instructions (Signed)
You were seen today in the [facility] for left ear and left eye discomfort . Labs were collected, results will be available via MyChart or, if abnormal, you will be contacted by clinic staff. You were prescribed medications, please take as directed. Please follow up in 3 mths for annual wellness visit. Please follow up with ophthalmology in 1-2 wks for reevaluation of visual changes.     Eye Doctors That Accept Medicaid and/or [facility]  [facility] [address]  [place], [place] [address] [phone] [email or web address]   [facility] [address] [place], [place] [address] [phone] [email or web address]   [facility] [place] [address] [place], [place] [address] Located next to [employer] Phone: [phone] [facility], [facility] [address] [place], [place] [address] Located next to [employer] Phone: [phone] [email or web address]   [facility] [address] [address] [place], [place] [address] [phone] [email or web address]   [facility] [address] [place], [place] [address] [phone] [email or web address]   [facility] [address]  [place], [place] [address] [phone] [email or web address]

## 2021-08-18 ENCOUNTER — Telehealth: Payer: Self-pay

## 2021-08-18 LAB — LIPID PANEL
Chol/HDL Ratio: 5.1 ratio — ABNORMAL HIGH (ref 0.0–5.0)
Cholesterol, Total: 249 mg/dL — ABNORMAL HIGH (ref 100–199)
HDL: 49 mg/dL (ref >39)
LDL Chol Calc (NIH): 164 mg/dL — ABNORMAL HIGH (ref 0–99)
Triglycerides: 195 mg/dL — ABNORMAL HIGH (ref 0–149)
VLDL Cholesterol Cal: 36 mg/dL (ref 5–40)

## 2021-08-18 NOTE — Telephone Encounter (Signed)
Pt asking if he needed cholesterol med from his results

## 2021-08-19 ENCOUNTER — Other Ambulatory Visit: Payer: Self-pay | Admitting: Nurse Practitioner

## 2021-08-19 ENCOUNTER — Encounter: Payer: Self-pay | Admitting: Nurse Practitioner

## 2021-08-19 DIAGNOSIS — E782 Mixed hyperlipidemia: Secondary | ICD-10-CM

## 2021-08-19 MED ORDER — OMEGA-3-ACID ETHYL ESTERS 1 G PO CAPS
2.0000 g | ORAL_CAPSULE | Freq: Every day | ORAL | 2 refills | Status: DC
Start: 2021-08-19 — End: 2021-11-16

## 2021-08-19 MED ORDER — ATORVASTATIN CALCIUM 40 MG PO TABS: 40.0000 mg | ORAL_TABLET | Freq: Every day | ORAL | 2 refills | Status: DC

## 2021-11-15 ENCOUNTER — Ambulatory Visit: Payer: 59 | Admitting: Nurse Practitioner

## 2021-11-15 ENCOUNTER — Telehealth: Payer: Self-pay

## 2021-11-15 NOTE — Telephone Encounter (Signed)
Cholesterol med to be refilled ?

## 2021-11-16 ENCOUNTER — Other Ambulatory Visit: Payer: Self-pay | Admitting: Nurse Practitioner

## 2021-11-16 DIAGNOSIS — E782 Mixed hyperlipidemia: Secondary | ICD-10-CM

## 2021-11-16 MED ORDER — OMEGA-3-ACID ETHYL ESTERS 1 G PO CAPS: 2.0000 g | ORAL_CAPSULE | Freq: Every day | ORAL | 2 refills | Status: AC

## 2021-11-16 MED ORDER — ATORVASTATIN CALCIUM 40 MG PO TABS: 40.0000 mg | ORAL_TABLET | Freq: Every day | ORAL | 2 refills | Status: DC

## 2022-02-15 ENCOUNTER — Other Ambulatory Visit: Payer: Self-pay

## 2022-02-15 DIAGNOSIS — E782 Mixed hyperlipidemia: Secondary | ICD-10-CM

## 2022-02-17 MED ORDER — ATORVASTATIN CALCIUM 40 MG PO TABS: 40.0000 mg | ORAL_TABLET | Freq: Every day | ORAL | 2 refills | Status: DC

## 2022-05-31 ENCOUNTER — Other Ambulatory Visit: Payer: Self-pay | Admitting: Nurse Practitioner

## 2022-05-31 DIAGNOSIS — E782 Mixed hyperlipidemia: Secondary | ICD-10-CM

## 2022-06-08 ENCOUNTER — Ambulatory Visit: Payer: No Typology Code available for payment source

## 2022-06-08 ENCOUNTER — Inpatient Hospital Stay
Admission: RE | Admit: 2022-06-08 | Discharge: 2022-06-08 | Disposition: A | Discharge status: Home / Self Care | Payer: No Typology Code available for payment source | Source: Ambulatory Visit | Attending: Nurse Practitioner | Admitting: Nurse Practitioner

## 2022-06-23 ENCOUNTER — Encounter: Payer: Self-pay | Admitting: Nurse Practitioner

## 2022-06-23 ENCOUNTER — Ambulatory Visit (INDEPENDENT_AMBULATORY_CARE_PROVIDER_SITE_OTHER): Payer: No Typology Code available for payment source | Admitting: Nurse Practitioner

## 2022-06-23 VITALS — BP 129/76 | HR 58 | Temp 98.6°F | Wt 204.0 lb

## 2022-06-23 DIAGNOSIS — J302 Other seasonal allergic rhinitis: Secondary | ICD-10-CM

## 2022-06-23 DIAGNOSIS — R3915 Urgency of urination: Secondary | ICD-10-CM

## 2022-06-23 DIAGNOSIS — H547 Unspecified visual loss: Secondary | ICD-10-CM

## 2022-06-23 DIAGNOSIS — Z23 Encounter for immunization: Secondary | ICD-10-CM

## 2022-06-23 DIAGNOSIS — E785 Hyperlipidemia, unspecified: Secondary | ICD-10-CM

## 2022-06-23 DIAGNOSIS — R7303 Prediabetes: Secondary | ICD-10-CM

## 2022-06-23 DIAGNOSIS — Z1211 Encounter for screening for malignant neoplasm of colon: Secondary | ICD-10-CM

## 2022-06-23 DIAGNOSIS — E559 Vitamin D deficiency, unspecified: Secondary | ICD-10-CM

## 2022-06-23 DIAGNOSIS — R5383 Other fatigue: Secondary | ICD-10-CM

## 2022-06-23 DIAGNOSIS — R739 Hyperglycemia, unspecified: Secondary | ICD-10-CM

## 2022-06-23 LAB — POCT URINALYSIS DIP (CLINITEK)
Bilirubin, UA: NEGATIVE
Blood, UA: NEGATIVE
Glucose, UA: NEGATIVE mg/dL
Ketones, POC UA: NEGATIVE mg/dL
Leukocytes, UA: NEGATIVE
Nitrite, UA: NEGATIVE
Spec Grav, UA: 1.025 (ref 1.010–1.025)
Urobilinogen, UA: 1 E.U./dL
pH, UA: 6.5 (ref 5.0–8.0)

## 2022-06-23 LAB — POCT GLYCOSYLATED HEMOGLOBIN (HGB A1C)
HbA1c, POC (controlled diabetic range): 5.9 % (ref 0.0–7.0)
HbA1c, POC (prediabetic range): 5.9 % (ref 5.7–6.4)

## 2022-06-23 MED ORDER — CETIRIZINE HCL 10 MG PO TABS: 10.0000 mg | ORAL_TABLET | Freq: Every day | ORAL | 0 refills | Status: AC

## 2022-06-23 MED ORDER — OXYBUTYNIN CHLORIDE ER 5 MG PO TB24
5.0000 mg | ORAL_TABLET | Freq: Every day | ORAL | 0 refills | Status: AC
Start: 2022-06-23 — End: ?

## 2022-06-23 NOTE — Assessment & Plan Note (Signed)
Chronic condition, has Ditropan ordered but he has not been taking the medication. Patient agreed to start taking Ditropan 5 mg daily at bedtime Medication reordered Patient referred to urology. UA was negative for nitrite, leukocyte. PSA pending

## 2022-06-23 NOTE — Assessment & Plan Note (Signed)
Patient educated on CDC recommendation for theinfluenza vaccine. Verbal consent was obtained from the patient, vaccine administered by nurse, no sign of adverse reactions noted at this time.  

## 2022-06-23 NOTE — Patient Instructions (Addendum)
Flu vaccine today Please get your shingles vaccine at the pharmacy , schedule an appointment for fasting labs next week     2. Visual changes - Ambulatory referral to Ophthalmology  3. Urinary urgency  - PSA - Ambulatory referral to Urology - CMP14+EGFR - oxybutynin (DITROPAN XL) 5 MG 24 hr tablet; Take 1 tablet (5 mg total) by mouth at bedtime.  Dispense: 90 tablet; Refill: 0   5. Seasonal allergies  - cetirizine (ZYRTEC) 10 MG tablet; Take 1 tablet (10 mg total) by mouth daily.  Dispense: 90 tablet; Refill: 0     It is important that you exercise regularly at least 30 minutes 5 times a week as tolerated  Think about what you will eat, plan ahead. Choose " clean, green, fresh or frozen" over canned, processed or packaged foods which are more sugary, salty and fatty. 70 to 75% of food eaten should be vegetables and fruit. Three meals at set times with snacks allowed between meals, but they must be fruit or vegetables. Aim to eat over a 12 hour period , example 7 am to 7 pm, and STOP after  your last meal of the day. Drink water,generally about 64 ounces per day, no other drink is as healthy. Fruit juice is best enjoyed in a healthy way, by EATING the fruit.  Thanks for choosing [facility] we consider it a privelige to serve you.

## 2022-06-23 NOTE — Assessment & Plan Note (Addendum)
Lab Results  Component Value Date   CHOL 249 (H) 08/17/2021   CHOL 202 (H) 03/01/2020   CHOL 208 (H) 11/10/2016   Lab Results  Component Value Date   HDL 49 08/17/2021   HDL 51 03/01/2020   HDL 45 11/10/2016   Lab Results  Component Value Date   LDLCALC 164 (H) 08/17/2021   LDLCALC 127 (H) 03/01/2020   LDLCALC 124 (H) 11/10/2016   Lab Results  Component Value Date   TRIG 195 (H) 08/17/2021   TRIG 137 03/01/2020   TRIG 197 (H) 11/10/2016   Lab Results  Component Value Date   CHOLHDL 5.1 (H) 08/17/2021   CHOLHDL 4.0 03/01/2020   CHOLHDL 4.6 11/10/2016   No results found for: "LDLDIRECT"  Currently on atorvastatin 40 mg daily Check fasting lipid panel Avoid fatty fried foods.

## 2022-06-23 NOTE — Assessment & Plan Note (Addendum)
Most recent A1c 5.9 Avoid sugar sweets soda

## 2022-06-23 NOTE — Progress Notes (Signed)
Established Patient Office Visit  Subjective:  Patient ID: Jeffery Reyes, male    DOB: 1961-04-28  Age: 61 y.o. MRN: [id number]  CC:  Chief Complaint  Patient presents with   Fatigue   Eye Problem    Vision changed     HPI Jeffery Reyes is a 61 y.o. male with past medical history of urinary urgency, hyperlipidemia, visual problems presents for follow-up for hyperlipidemia and to establish care with new provider.   Patient was last seen at this practice over a year ago previous PCP Passmore T, NP.    Patient c/o blurry vision deteriorating for months, he was prescribed medication for dry eye in the past but he is not taking the med. He would like referral to the opthamologist. Patient denies itchy eyes, eye pain, eye discharge.   Patient complains of chronic urinary urgency, states that urinates  6 times during the night.  He denies urinary hesitancy, urinary retention, abdominal pain, bloody urine, fever, chills.  He was prescribed oxybutynin but he did not take the medication because he does not like to take medications.  Patient complains of sneezing, runny nose throat clearing every morning, 4 months.  He stopped smoking about 20 years ago.  He denies fever, chills, shortness of breath, wheezing.   Due for colon cancer screening Cologuard test ordered today.  Influenza vaccine given in room..  Patient was encouraged to get shingles vaccine at the pharmacy  Past Medical History:  Diagnosis Date   Elevated cholesterol 02/2020   GERD (gastroesophageal reflux disease) 02/2020   High cholesterol    Kidney stones    Peripheral neuropathy 02/2020   Proteinuria    Urinary urgency 02/2020   Urinary urgency    Visual problems 02/2020   Vitamin D deficiency 02/2020    Past Surgical History:  Procedure Laterality Date   nerve release hand Left 2015   REPAIR / RECONSTRUCTION COLLATERAL LIGAMENT HAND Left     Family History  Problem Relation Age of Onset   Hypertension Father     Stroke Brother     Social History   Socioeconomic History   Marital status: Married    Spouse name: Not on file   Number of children: Not on file   Years of education: Not on file   Highest education level: Not on file  Occupational History   Not on file  Tobacco Use   Smoking status: Former   Smokeless tobacco: Never  Substance and Sexual Activity   Alcohol use: No    Alcohol/week: 0.0 standard drinks of alcohol   Drug use: No   Sexual activity: Not on file  Other Topics Concern   Not on file  Social History Narrative   Not on file   Social Determinants of Health   Financial Resource Strain: Not on file  Food Insecurity: Not on file  Transportation Needs: Not on file  Physical Activity: Not on file  Stress: Not on file  Social Connections: Not on file  Intimate Partner Violence: Not on file    Outpatient Medications Prior to Visit  Medication Sig Dispense Refill   atorvastatin (LIPITOR) 40 MG tablet TAKE 1 TABLET(40 MG) BY MOUTH DAILY 30 tablet 0   Cholecalciferol (VITAMIN D) 2000 units CAPS Take by mouth.     gabapentin (NEURONTIN) 300 MG capsule Take 1 capsule (300 mg total) by mouth at bedtime. (Patient not taking: Reported on 08/17/2021) 90 capsule 3   olopatadine (PATADAY) 0.1 % ophthalmic solution Place 1  drop into the left eye 2 (two) times daily as needed (dry eyes). (Patient not taking: Reported on 06/23/2022) 5 mL 2   omega-3 acid ethyl esters (LOVAZA) 1 g capsule Take 2 capsules (2 g total) by mouth daily. (Patient not taking: Reported on 06/23/2022) 60 capsule 2   cetirizine (ZYRTEC) 10 MG tablet Take 1 tablet (10 mg total) by mouth daily. (Patient not taking: Reported on 08/17/2021) 90 tablet 3   oxybutynin (DITROPAN XL) 5 MG 24 hr tablet Take 1 tablet (5 mg total) by mouth at bedtime. (Patient not taking: Reported on 08/17/2021) 90 tablet 3   No facility-administered medications prior to visit.    No Known Allergies  ROS Review of Systems   Constitutional:  Negative for activity change, appetite change, chills, diaphoresis and fever.  HENT: Negative.  Negative for congestion, dental problem, drooling, ear discharge and ear pain.   Eyes:  Positive for visual disturbance. Negative for pain, discharge, redness and itching.  Respiratory:  Negative for apnea, cough, choking, chest tightness, shortness of breath, wheezing and stridor.   Cardiovascular: Negative.  Negative for chest pain, palpitations and leg swelling.  Gastrointestinal: Negative.  Negative for abdominal distention, abdominal pain, anal bleeding and blood in stool.  Genitourinary:  Positive for frequency. Negative for difficulty urinating, dysuria, enuresis, flank pain, genital sores and hematuria.  Musculoskeletal: Negative.   Skin: Negative.   Allergic/Immunologic: Positive for environmental allergies. Negative for food allergies.  Neurological: Negative.  Negative for dizziness, facial asymmetry, light-headedness and headaches.  Psychiatric/Behavioral: Negative.  Negative for agitation, behavioral problems, confusion, decreased concentration and dysphoric mood.       Objective:    Physical Exam Constitutional:      General: He is not in acute distress.    Appearance: Normal appearance. He is not ill-appearing or toxic-appearing.  HENT:     Right Ear: Tympanic membrane, ear canal and external ear normal. There is no impacted cerumen.     Left Ear: Tympanic membrane, ear canal and external ear normal. There is no impacted cerumen.     Nose: Nose normal. No congestion or rhinorrhea.     Mouth/Throat:     Mouth: Mucous membranes are moist.     Pharynx: Oropharynx is clear.  Eyes:     General: No scleral icterus.       Right eye: No discharge.        Left eye: No discharge.     Extraocular Movements: Extraocular movements intact.     Conjunctiva/sclera: Conjunctivae normal.     Pupils: Pupils are equal, round, and reactive to light.  Cardiovascular:      Rate and Rhythm: Regular rhythm. Bradycardia present.     Pulses: Normal pulses.     Heart sounds: No murmur heard.    No friction rub. No gallop.  Pulmonary:     Effort: Pulmonary effort is normal. No respiratory distress.     Breath sounds: Normal breath sounds. No stridor. No wheezing, rhonchi or rales.  Chest:     Chest wall: No tenderness.  Abdominal:     General: There is no distension.     Palpations: Abdomen is soft. There is no mass.     Tenderness: There is no abdominal tenderness. There is no right CVA tenderness or guarding.     Hernia: No hernia is present.  Musculoskeletal:        General: No swelling, tenderness, deformity or signs of injury.     Right lower leg: No edema.  Left lower leg: No edema.  Skin:    General: Skin is warm and dry.     Capillary Refill: Capillary refill takes less than 2 seconds.     Coloration: Skin is not jaundiced or pale.     Findings: No bruising, erythema, lesion or rash.  Neurological:     Mental Status: He is alert.     Cranial Nerves: No cranial nerve deficit.     Sensory: No sensory deficit.     Motor: No weakness.     Coordination: Coordination normal.     Gait: Gait normal.  Psychiatric:        Mood and Affect: Mood normal.        Behavior: Behavior normal.        Thought Content: Thought content normal.        Judgment: Judgment normal.     BP 129/76   Pulse (!) 58   Temp 98.6 F (37 C)   Wt 204 lb (92.5 kg)   SpO2 100%   BMI 28.45 kg/m  Wt Readings from Last 3 Encounters:  06/23/22 204 lb (92.5 kg)  08/17/21 217 lb 2 oz (98.5 kg)  11/22/20 207 lb (93.9 kg)    Lab Results  Component Value Date   TSH 2.200 03/01/2020   Lab Results  Component Value Date   WBC 3.1 (L) 03/01/2020   HGB 15.9 03/01/2020   HCT 45.4 03/01/2020   MCV 89 03/01/2020   PLT 219 03/01/2020   Lab Results  Component Value Date   NA 141 03/01/2020   K 4.1 03/01/2020   CO2 23 03/01/2020   GLUCOSE 96 03/01/2020   BUN 16  03/01/2020   CREATININE 0.91 03/01/2020   BILITOT 0.4 03/01/2020   ALKPHOS 55 03/01/2020   AST 16 03/01/2020   ALT 12 03/01/2020   PROT 6.8 03/01/2020   ALBUMIN 4.5 03/01/2020   CALCIUM 9.5 03/01/2020   Lab Results  Component Value Date   CHOL 249 (H) 08/17/2021   Lab Results  Component Value Date   HDL 49 08/17/2021   Lab Results  Component Value Date   LDLCALC 164 (H) 08/17/2021   Lab Results  Component Value Date   TRIG 195 (H) 08/17/2021   Lab Results  Component Value Date   CHOLHDL 5.1 (H) 08/17/2021   Lab Results  Component Value Date   HGBA1C 5.9 06/23/2022   HGBA1C 5.9 06/23/2022      Assessment & Plan:   Problem List Items Addressed This Visit       Other   Vitamin D deficiency   Relevant Orders   Vitamin D, 25-hydroxy   Dyslipidemia    Lab Results  Component Value Date   CHOL 249 (H) 08/17/2021   CHOL 202 (H) 03/01/2020   CHOL 208 (H) 11/10/2016   Lab Results  Component Value Date   HDL 49 08/17/2021   HDL 51 03/01/2020   HDL 45 11/10/2016   Lab Results  Component Value Date   LDLCALC 164 (H) 08/17/2021   LDLCALC 127 (H) 03/01/2020   LDLCALC 124 (H) 11/10/2016   Lab Results  Component Value Date   TRIG 195 (H) 08/17/2021   TRIG 137 03/01/2020   TRIG 197 (H) 11/10/2016   Lab Results  Component Value Date   CHOLHDL 5.1 (H) 08/17/2021   CHOLHDL 4.0 03/01/2020   CHOLHDL 4.6 11/10/2016  No results found for: "LDLDIRECT"  Currently on atorvastatin 40 mg daily Check fasting lipid panel Avoid  fatty fried foods.      Relevant Orders   Lipid Panel   Urinary urgency - Primary    Chronic condition, has Ditropan ordered but he has not been taking the medication. Patient agreed to start taking Ditropan 5 mg daily at bedtime Medication reordered Patient referred to urology. UA was negative for nitrite, leukocyte. PSA pending       Relevant Medications   oxybutynin (DITROPAN XL) 5 MG 24 hr tablet   Other Relevant Orders    Ambulatory referral to Urology   PSA   CMP14+EGFR   POCT URINALYSIS DIP (CLINITEK) (Completed)   Prediabetes    Most recent A1c 5.9 Avoid sugar sweets soda      Vision problems    Chronic condition now getting worse Due for eye exam Patient referred to ophthalmology      Relevant Orders   Ambulatory referral to Ophthalmology   Seasonal allergies    Take Zyrtec 10 mg daily Medication reordered Avoid allergens      Relevant Medications   cetirizine (ZYRTEC) 10 MG tablet   Need for influenza vaccination    Patient educated on CDC recommendation for the influenza vaccine. Verbal consent was obtained from the patient, vaccine administered by nurse, no sign of adverse reactions noted at this time.       Other Visit Diagnoses     Screening for colon cancer       Relevant Orders   Cologuard   Other fatigue       Relevant Orders   Vitamin D, 25-hydroxy   CBC with Differential       Meds ordered this encounter  Medications   oxybutynin (DITROPAN XL) 5 MG 24 hr tablet    Sig: Take 1 tablet (5 mg total) by mouth at bedtime.    Dispense:  90 tablet    Refill:  0   cetirizine (ZYRTEC) 10 MG tablet    Sig: Take 1 tablet (10 mg total) by mouth daily.    Dispense:  90 tablet    Refill:  0    Follow-up: Return in about 6 weeks (around 08/04/2022) for CPE.Renee Rival, FNP

## 2022-06-23 NOTE — Assessment & Plan Note (Addendum)
Take Zyrtec 10 mg daily Medication reordered Avoid allergens

## 2022-06-23 NOTE — Assessment & Plan Note (Signed)
Chronic condition now getting worse Due for eye exam Patient referred to ophthalmology

## 2022-06-26 ENCOUNTER — Other Ambulatory Visit: Payer: No Typology Code available for payment source

## 2022-06-26 DIAGNOSIS — E559 Vitamin D deficiency, unspecified: Secondary | ICD-10-CM

## 2022-06-26 DIAGNOSIS — R5383 Other fatigue: Secondary | ICD-10-CM

## 2022-06-26 DIAGNOSIS — E785 Hyperlipidemia, unspecified: Secondary | ICD-10-CM

## 2022-06-26 DIAGNOSIS — R3915 Urgency of urination: Secondary | ICD-10-CM

## 2022-06-27 ENCOUNTER — Other Ambulatory Visit: Payer: Self-pay | Admitting: Nurse Practitioner

## 2022-06-27 DIAGNOSIS — D709 Neutropenia, unspecified: Secondary | ICD-10-CM

## 2022-06-27 LAB — CBC WITH DIFFERENTIAL/PLATELET
Basophils Absolute: 0 10*3/uL (ref 0.0–0.2)
Basos: 1 %
EOS (ABSOLUTE): 0.1 10*3/uL (ref 0.0–0.4)
Eos: 3 %
Hematocrit: 45.2 % (ref 37.5–51.0)
Hemoglobin: 15.3 g/dL (ref 13.0–17.7)
Immature Grans (Abs): 0 10*3/uL (ref 0.0–0.1)
Immature Granulocytes: 0 %
Lymphocytes Absolute: 1.3 10*3/uL (ref 0.7–3.1)
Lymphs: 46 %
MCH: 30.5 pg (ref 26.6–33.0)
MCHC: 33.8 g/dL (ref 31.5–35.7)
MCV: 90 fL (ref 79–97)
Monocytes Absolute: 0.3 10*3/uL (ref 0.1–0.9)
Monocytes: 11 %
Neutrophils Absolute: 1.1 10*3/uL — ABNORMAL LOW (ref 1.4–7.0)
Neutrophils: 39 %
Platelets: 193 10*3/uL (ref 150–450)
RBC: 5.02 x10E6/uL (ref 4.14–5.80)
RDW: 12.1 % (ref 11.6–15.4)
WBC: 2.7 10*3/uL — ABNORMAL LOW (ref 3.4–10.8)

## 2022-06-27 LAB — LIPID PANEL
Chol/HDL Ratio: 2.7 ratio (ref 0.0–5.0)
Cholesterol, Total: 121 mg/dL (ref 100–199)
HDL: 45 mg/dL (ref >39)
LDL Chol Calc (NIH): 59 mg/dL (ref 0–99)
Triglycerides: 89 mg/dL (ref 0–149)
VLDL Cholesterol Cal: 17 mg/dL (ref 5–40)

## 2022-06-27 LAB — CMP14+EGFR
ALT: 11 IU/L (ref 0–44)
AST: 15 IU/L (ref 0–40)
Albumin/Globulin Ratio: 2.2 (ref 1.2–2.2)
Albumin: 4.4 g/dL (ref 3.8–4.9)
Alkaline Phosphatase: 59 IU/L (ref 44–121)
BUN/Creatinine Ratio: 15 (ref 10–24)
BUN: 13 mg/dL (ref 8–27)
Bilirubin Total: 0.5 mg/dL (ref 0.0–1.2)
CO2: 25 mmol/L (ref 20–29)
Calcium: 9.6 mg/dL (ref 8.6–10.2)
Chloride: 103 mmol/L (ref 96–106)
Creatinine, Ser: 0.88 mg/dL (ref 0.76–1.27)
Globulin, Total: 2 g/dL (ref 1.5–4.5)
Glucose: 106 mg/dL — ABNORMAL HIGH (ref 70–99)
Potassium: 4.2 mmol/L (ref 3.5–5.2)
Sodium: 142 mmol/L (ref 134–144)
Total Protein: 6.4 g/dL (ref 6.0–8.5)
eGFR: 98 mL/min/{1.73_m2} (ref >59)

## 2022-06-27 LAB — PSA: Prostate Specific Ag, Serum: 0.7 ng/mL (ref 0.0–4.0)

## 2022-06-27 LAB — VITAMIN D 25 HYDROXY (VIT D DEFICIENCY, FRACTURES): Vit D, 25-Hydroxy: 33 ng/mL (ref 30.0–100.0)

## 2022-06-27 NOTE — Progress Notes (Signed)
Lipid panel is stable , normal PSA, normal Vitamin D level , normal kidney function.   WBC count is lower than normal and it has been like this for years , I have sent in a referral to hematology for further evaluations.

## 2022-07-03 ENCOUNTER — Other Ambulatory Visit: Payer: Self-pay | Admitting: Nurse Practitioner

## 2022-07-03 DIAGNOSIS — E782 Mixed hyperlipidemia: Secondary | ICD-10-CM

## 2022-07-10 ENCOUNTER — Other Ambulatory Visit: Payer: Self-pay | Admitting: Family

## 2022-07-10 DIAGNOSIS — D709 Neutropenia, unspecified: Secondary | ICD-10-CM

## 2022-07-11 ENCOUNTER — Inpatient Hospital Stay: Payer: Self-pay | Admitting: Family

## 2022-07-11 ENCOUNTER — Inpatient Hospital Stay: Payer: No Typology Code available for payment source

## 2022-08-01 ENCOUNTER — Encounter: Payer: Self-pay | Admitting: Family

## 2022-08-01 ENCOUNTER — Other Ambulatory Visit: Payer: No Typology Code available for payment source

## 2022-08-07 ENCOUNTER — Inpatient Hospital Stay: Payer: Self-pay | Admitting: Family

## 2022-08-07 ENCOUNTER — Ambulatory Visit (INDEPENDENT_AMBULATORY_CARE_PROVIDER_SITE_OTHER): Payer: No Typology Code available for payment source | Admitting: Nurse Practitioner

## 2022-08-07 ENCOUNTER — Inpatient Hospital Stay: Payer: Self-pay | Attending: Hematology & Oncology

## 2022-08-07 ENCOUNTER — Encounter: Payer: Self-pay | Admitting: Nurse Practitioner

## 2022-08-07 VITALS — BP 151/88 | HR 62 | Ht 71.0 in | Wt 206.0 lb

## 2022-08-07 DIAGNOSIS — H5712 Ocular pain, left eye: Secondary | ICD-10-CM

## 2022-08-07 DIAGNOSIS — R590 Localized enlarged lymph nodes: Secondary | ICD-10-CM

## 2022-08-07 DIAGNOSIS — Z1329 Encounter for screening for other suspected endocrine disorder: Secondary | ICD-10-CM

## 2022-08-07 MED ORDER — OLOPATADINE HCL 0.1 % OP SOLN: 1.0000 [drp] | Freq: Two times a day (BID) | OPHTHALMIC | 2 refills | Status: AC | PRN

## 2022-08-07 NOTE — Progress Notes (Signed)
@Patient  ID: Jeffery Reyes, male    DOB: 07-May-1961, 61 y.o.   MRN: [id number]  Chief Complaint  Patient presents with   Eye Problem    Referring provider: Ivonne Andrew, NP  HPI  Jeffery Reyes is a 61 y.o. male with past medical history of urinary urgency, hyperlipidemia, visual problems    Patient presents today with left arm pain.  Patient already has a referral placed in the system for an eye doctor.  We will prescribe Pataday drops until he can get this appointment.  Patient is due for blood work today.  He also complains today of some swollen lymph nodes to his neck.  We will check labs.  Patient's blood pressure is borderline in office today.  Low-salt diet was recommended. Denies f/c/s, n/v/d, hemoptysis, PND, leg swelling Denies chest pain or edema      No Known Allergies  Immunization History  Administered Date(s) Administered   Influenza,inj,Quad PF,6+ Mos 08/25/2014, 06/23/2022   Tdap 08/25/2014    Past Medical History:  Diagnosis Date   Elevated cholesterol 02/2020   GERD (gastroesophageal reflux disease) 02/2020   High cholesterol    Kidney stones    Peripheral neuropathy 02/2020   Proteinuria    Urinary urgency 02/2020   Urinary urgency    Visual problems 02/2020   Vitamin D deficiency 02/2020    Tobacco History: Social History   Tobacco Use  Smoking Status Former  Smokeless Tobacco Never   Counseling given: Not Answered   Outpatient Encounter Medications as of 08/07/2022  Medication Sig   atorvastatin (LIPITOR) 40 MG tablet TAKE 1 TABLET(40 MG) BY MOUTH DAILY   cetirizine (ZYRTEC) 10 MG tablet Take 1 tablet (10 mg total) by mouth daily.   Cholecalciferol (VITAMIN D) 2000 units CAPS Take by mouth.   gabapentin (NEURONTIN) 300 MG capsule Take 1 capsule (300 mg total) by mouth at bedtime. (Patient not taking: Reported on 08/17/2021)   olopatadine (PATADAY) 0.1 % ophthalmic solution Place 1 drop into the left eye 2 (two) times daily as needed  (dry eyes).   omega-3 acid ethyl esters (LOVAZA) 1 g capsule Take 2 capsules (2 g total) by mouth daily. (Patient not taking: Reported on 06/23/2022)   oxybutynin (DITROPAN XL) 5 MG 24 hr tablet Take 1 tablet (5 mg total) by mouth at bedtime.   [DISCONTINUED] olopatadine (PATADAY) 0.1 % ophthalmic solution Place 1 drop into the left eye 2 (two) times daily as needed (dry eyes). (Patient not taking: Reported on 06/23/2022)   No facility-administered encounter medications on file as of 08/07/2022.     Review of Systems  Review of Systems  Constitutional: Negative.   HENT: Negative.    Cardiovascular: Negative.   Gastrointestinal: Negative.   Allergic/Immunologic: Negative.   Neurological: Negative.   Psychiatric/Behavioral: Negative.         Physical Exam  BP (!) 151/88   Pulse 62   Ht 5\' 11"  (1.803 m)   Wt 206 lb (93.4 kg)   SpO2 98%   BMI 28.73 kg/m   Wt Readings from Last 5 Encounters:  08/07/22 206 lb (93.4 kg)  06/23/22 204 lb (92.5 kg)  08/17/21 217 lb 2 oz (98.5 kg)  11/22/20 207 lb (93.9 kg)  10/12/20 206 lb (93.4 kg)     Physical Exam Vitals and nursing note reviewed.  Constitutional:      General: He is not in acute distress.    Appearance: He is well-developed.  Cardiovascular:  Rate and Rhythm: Normal rate and regular rhythm.  Pulmonary:     Effort: Pulmonary effort is normal.     Breath sounds: Normal breath sounds.  Skin:    General: Skin is warm and dry.  Neurological:     Mental Status: He is alert and oriented to person, place, and time.      Lab Results:  CBC    Component Value Date/Time   WBC 3.3 (L) 08/07/2022 1605   WBC 3.2 (L) 09/01/2014 0841   RBC 5.16 08/07/2022 1605   RBC 5.04 09/01/2014 0841   HGB 15.5 08/07/2022 1605   HCT 44.3 08/07/2022 1605   PLT 208 08/07/2022 1605   MCV 86 08/07/2022 1605   MCH 30.0 08/07/2022 1605   MCH 30.2 09/01/2014 0841   MCHC 35.0 08/07/2022 1605   MCHC 34.9 09/01/2014 0841   RDW 11.9  08/07/2022 1605   LYMPHSABS 1.3 06/26/2022 0859   MONOABS 0.3 09/01/2014 0841   EOSABS 0.1 06/26/2022 0859   BASOSABS 0.0 06/26/2022 0859    BMET    Component Value Date/Time   NA 141 08/07/2022 1605   K 4.1 08/07/2022 1605   CL 102 08/07/2022 1605   CO2 23 08/07/2022 1605   GLUCOSE 79 08/07/2022 1605   GLUCOSE 85 11/10/2016 1217   BUN 10 08/07/2022 1605   CREATININE 0.86 08/07/2022 1605   CREATININE 0.95 11/10/2016 1217   CALCIUM 9.7 08/07/2022 1605   GFRNONAA 93 03/01/2020 0850   GFRNONAA >89 11/10/2016 1217   GFRAA 108 03/01/2020 0850   GFRAA >89 11/10/2016 1217     Assessment & Plan:   Left eye pain - olopatadine (PATADAY) 0.1 % ophthalmic solution; Place 1 drop into the left eye 2 (two) times daily as needed (dry eyes).  Dispense: 5 mL; Refill: 2  2. Thyroid disorder screening  - TSH  3. Cervical lymphadenopathy  - CBC - Comprehensive metabolic panel  4. Borderline BP - low salt diet   Follow up:  Follow up in  3 months or sooner if needed     Ivonne Andrew, NP 08/09/2022

## 2022-08-07 NOTE — Patient Instructions (Signed)
1. Left eye pain  - olopatadine (PATADAY) 0.1 % ophthalmic solution; Place 1 drop into the left eye 2 (two) times daily as needed (dry eyes).  Dispense: 5 mL; Refill: 2  2. Thyroid disorder screening  - TSH  3. Cervical lymphadenopathy  - CBC - Comprehensive metabolic panel  4. Borderline BP - low salt diet   Follow up:  Follow up in  3 months or sooner if needed

## 2022-08-08 LAB — CBC
Hematocrit: 44.3 % (ref 37.5–51.0)
Hemoglobin: 15.5 g/dL (ref 13.0–17.7)
MCH: 30 pg (ref 26.6–33.0)
MCHC: 35 g/dL (ref 31.5–35.7)
MCV: 86 fL (ref 79–97)
Platelets: 208 10*3/uL (ref 150–450)
RBC: 5.16 x10E6/uL (ref 4.14–5.80)
RDW: 11.9 % (ref 11.6–15.4)
WBC: 3.3 10*3/uL — ABNORMAL LOW (ref 3.4–10.8)

## 2022-08-08 LAB — COMPREHENSIVE METABOLIC PANEL
ALT: 15 IU/L (ref 0–44)
AST: 16 IU/L (ref 0–40)
Albumin/Globulin Ratio: 1.8 (ref 1.2–2.2)
Albumin: 4.4 g/dL (ref 3.8–4.9)
Alkaline Phosphatase: 62 IU/L (ref 44–121)
BUN/Creatinine Ratio: 12 (ref 10–24)
BUN: 10 mg/dL (ref 8–27)
Bilirubin Total: 0.6 mg/dL (ref 0.0–1.2)
CO2: 23 mmol/L (ref 20–29)
Calcium: 9.7 mg/dL (ref 8.6–10.2)
Chloride: 102 mmol/L (ref 96–106)
Creatinine, Ser: 0.86 mg/dL (ref 0.76–1.27)
Globulin, Total: 2.4 g/dL (ref 1.5–4.5)
Glucose: 79 mg/dL (ref 70–99)
Potassium: 4.1 mmol/L (ref 3.5–5.2)
Sodium: 141 mmol/L (ref 134–144)
Total Protein: 6.8 g/dL (ref 6.0–8.5)
eGFR: 99 mL/min/{1.73_m2} (ref >59)

## 2022-08-08 LAB — TSH: TSH: 1.19 u[IU]/mL (ref 0.450–4.500)

## 2022-08-09 ENCOUNTER — Encounter: Payer: Self-pay | Admitting: Nurse Practitioner

## 2022-08-09 DIAGNOSIS — H5712 Ocular pain, left eye: Secondary | ICD-10-CM | POA: Insufficient documentation

## 2022-08-09 NOTE — Assessment & Plan Note (Signed)
-   olopatadine (PATADAY) 0.1 % ophthalmic solution; Place 1 drop into the left eye 2 (two) times daily as needed (dry eyes).  Dispense: 5 mL; Refill: 2  2. Thyroid disorder screening  - TSH  3. Cervical lymphadenopathy  - CBC - Comprehensive metabolic panel  4. Borderline BP - low salt diet   Follow up:  Follow up in  3 months or sooner if needed
